# Patient Record
Sex: Female | Born: 1965 | Race: White | Hispanic: No | Marital: Married | Smoking: Current some day smoker
Health system: Southern US, Community
[De-identification: ages and names within clinical notes are randomized; demographics above are authoritative.]

## PROBLEM LIST (undated history)

## (undated) DIAGNOSIS — J189 Pneumonia, unspecified organism: Secondary | ICD-10-CM

## (undated) DIAGNOSIS — E538 Deficiency of other specified B group vitamins: Secondary | ICD-10-CM

## (undated) DIAGNOSIS — Z720 Tobacco use: Secondary | ICD-10-CM

## (undated) DIAGNOSIS — Z951 Presence of aortocoronary bypass graft: Secondary | ICD-10-CM

## (undated) DIAGNOSIS — D539 Nutritional anemia, unspecified: Secondary | ICD-10-CM

## (undated) DIAGNOSIS — E785 Hyperlipidemia, unspecified: Secondary | ICD-10-CM

## (undated) DIAGNOSIS — I2510 Atherosclerotic heart disease of native coronary artery without angina pectoris: Secondary | ICD-10-CM

## (undated) HISTORY — DX: Nutritional anemia, unspecified: D53.9

## (undated) HISTORY — DX: Tobacco use: Z72.0

## (undated) HISTORY — DX: Hyperlipidemia, unspecified: E78.5

## (undated) HISTORY — DX: Atherosclerotic heart disease of native coronary artery without angina pectoris: I25.10

## (undated) HISTORY — DX: Presence of aortocoronary bypass graft: Z95.1

## (undated) HISTORY — DX: Deficiency of other specified B group vitamins: E53.8

## (undated) HISTORY — PX: CORONARY ARTERY BYPASS GRAFT: SHX141

---

## 2005-06-28 ENCOUNTER — Inpatient Hospital Stay (HOSPITAL_COMMUNITY): Admission: RE | Admit: 2005-06-28 | Discharge: 2005-06-30 | Payer: Self-pay | Admitting: Obstetrics and Gynecology

## 2011-07-18 HISTORY — PX: CARDIAC CATHETERIZATION: SHX172

## 2011-08-09 ENCOUNTER — Encounter: Payer: Self-pay | Admitting: *Deleted

## 2011-08-09 ENCOUNTER — Telehealth: Payer: Self-pay | Admitting: *Deleted

## 2011-08-09 DIAGNOSIS — R079 Chest pain, unspecified: Secondary | ICD-10-CM

## 2011-08-09 DIAGNOSIS — R9430 Abnormal result of cardiovascular function study, unspecified: Secondary | ICD-10-CM

## 2011-08-09 NOTE — Telephone Encounter (Signed)
Per Dr. Kirke Corin, pt needs outpt cath. Currently inpt at [facility].  Cath scheduled. [facility] staff aware. Pt still inpt on 4th floor. They are aware cath is scheduled and instructions will be faxed to them at [phone]. They are asked to give pt instructions and have her call with any questions.

## 2011-08-13 ENCOUNTER — Telehealth: Payer: Self-pay | Admitting: *Deleted

## 2011-08-13 NOTE — Telephone Encounter (Signed)
Kelsey Padilla called in regards to her Cath on Wednesday, August 15, 2011. States that she takes A vitamin -B 12 injection on Wednesday morning. Is she suppose to take this injection before Her cath on Wednesday?

## 2011-08-13 NOTE — Telephone Encounter (Signed)
What insurance does the patient have?

## 2011-08-13 NOTE — Telephone Encounter (Signed)
No precert required.  Patient is self pay

## 2011-08-13 NOTE — Telephone Encounter (Signed)
Message     (L) Heart Cath Main Lab Dr. Duard Larsen, August 15, 2011

## 2011-08-13 NOTE — Telephone Encounter (Signed)
I just called Kelsey Padilla to verify and she is self-pay.

## 2011-08-13 NOTE — Telephone Encounter (Signed)
Pt states she will have to leave for [facility] around 6 or 6:30 am. She is aware she can do B12 injection Tues (if Wed am is too early) or Wed before the cath. Pt verbalized understanding.

## 2011-08-15 ENCOUNTER — Inpatient Hospital Stay (HOSPITAL_COMMUNITY)
Admission: RE | Admit: 2011-08-15 | Discharge: 2011-08-20 | DRG: 234 | Disposition: A | Discharge status: Home / Self Care | Payer: Medicaid Other | Source: Ambulatory Visit | Attending: Surgery | Admitting: Surgery

## 2011-08-15 ENCOUNTER — Ambulatory Visit (HOSPITAL_COMMUNITY): Payer: Medicaid Other

## 2011-08-15 ENCOUNTER — Inpatient Hospital Stay (HOSPITAL_COMMUNITY): Payer: Medicaid Other

## 2011-08-15 DIAGNOSIS — I2510 Atherosclerotic heart disease of native coronary artery without angina pectoris: Principal | ICD-10-CM | POA: Diagnosis present

## 2011-08-15 DIAGNOSIS — D62 Acute posthemorrhagic anemia: Secondary | ICD-10-CM | POA: Diagnosis not present

## 2011-08-15 DIAGNOSIS — F17200 Nicotine dependence, unspecified, uncomplicated: Secondary | ICD-10-CM | POA: Diagnosis present

## 2011-08-15 DIAGNOSIS — E8779 Other fluid overload: Secondary | ICD-10-CM | POA: Diagnosis not present

## 2011-08-15 DIAGNOSIS — Z7982 Long term (current) use of aspirin: Secondary | ICD-10-CM

## 2011-08-15 DIAGNOSIS — Z01810 Encounter for preprocedural cardiovascular examination: Secondary | ICD-10-CM

## 2011-08-15 DIAGNOSIS — J9383 Other pneumothorax: Secondary | ICD-10-CM | POA: Diagnosis present

## 2011-08-15 DIAGNOSIS — Q2111 Secundum atrial septal defect: Secondary | ICD-10-CM

## 2011-08-15 DIAGNOSIS — Q211 Atrial septal defect: Secondary | ICD-10-CM

## 2011-08-15 DIAGNOSIS — D518 Other vitamin B12 deficiency anemias: Secondary | ICD-10-CM | POA: Diagnosis present

## 2011-08-15 DIAGNOSIS — Z8249 Family history of ischemic heart disease and other diseases of the circulatory system: Secondary | ICD-10-CM

## 2011-08-15 LAB — CBC
HCT: 29 % — ABNORMAL LOW (ref 36.0–46.0)
MCH: 37.3 pg — ABNORMAL HIGH (ref 26.0–34.0)
MCV: 108.2 fL — ABNORMAL HIGH (ref 78.0–100.0)
RDW: 22.4 % — ABNORMAL HIGH (ref 11.5–15.5)
WBC: 6.3 10*3/uL (ref 4.0–10.5)

## 2011-08-15 LAB — BASIC METABOLIC PANEL
BUN: 13 mg/dL (ref 6–23)
CO2: 23 mEq/L (ref 19–32)
Calcium: 9.2 mg/dL (ref 8.4–10.5)
Creatinine, Ser: 0.72 mg/dL (ref 0.50–1.10)
GFR calc non Af Amer: >60 mL/min (ref >60)
Glucose, Bld: 99 mg/dL (ref 70–99)
Sodium: 139 mEq/L (ref 135–145)

## 2011-08-15 LAB — ABO/RH: ABO/RH(D): A POS

## 2011-08-16 ENCOUNTER — Inpatient Hospital Stay (HOSPITAL_COMMUNITY): Payer: Medicaid Other

## 2011-08-16 DIAGNOSIS — Z951 Presence of aortocoronary bypass graft: Secondary | ICD-10-CM | POA: Insufficient documentation

## 2011-08-16 DIAGNOSIS — I2510 Atherosclerotic heart disease of native coronary artery without angina pectoris: Secondary | ICD-10-CM

## 2011-08-16 HISTORY — DX: Presence of aortocoronary bypass graft: Z95.1

## 2011-08-16 LAB — POCT I-STAT 4, (NA,K, GLUC, HGB,HCT)
Glucose, Bld: 113 mg/dL — ABNORMAL HIGH (ref 70–99)
Glucose, Bld: 124 mg/dL — ABNORMAL HIGH (ref 70–99)
HCT: 22 % — ABNORMAL LOW (ref 36.0–46.0)
HCT: 23 % — ABNORMAL LOW (ref 36.0–46.0)
HCT: 24 % — ABNORMAL LOW (ref 36.0–46.0)
Hemoglobin: 8.5 g/dL — ABNORMAL LOW (ref 12.0–15.0)
Hemoglobin: 7.5 g/dL — ABNORMAL LOW (ref 12.0–15.0)
Hemoglobin: 8.2 g/dL — ABNORMAL LOW (ref 12.0–15.0)
Hemoglobin: 9.9 g/dL — ABNORMAL LOW (ref 12.0–15.0)
Potassium: 4 mEq/L (ref 3.5–5.1)
Potassium: 4.2 mEq/L (ref 3.5–5.1)
Potassium: 5.5 mEq/L — ABNORMAL HIGH (ref 3.5–5.1)
Potassium: 4.1 mEq/L (ref 3.5–5.1)
Sodium: 140 mEq/L (ref 135–145)
Sodium: 138 mEq/L (ref 135–145)

## 2011-08-16 LAB — GLUCOSE, CAPILLARY
Glucose-Capillary: 147 mg/dL — ABNORMAL HIGH (ref 70–99)
Glucose-Capillary: 125 mg/dL — ABNORMAL HIGH (ref 70–99)
Glucose-Capillary: 136 mg/dL — ABNORMAL HIGH (ref 70–99)
Glucose-Capillary: 125 mg/dL — ABNORMAL HIGH (ref 70–99)

## 2011-08-16 LAB — COMPREHENSIVE METABOLIC PANEL
Albumin: 3.1 g/dL — ABNORMAL LOW (ref 3.5–5.2)
BUN: 10 mg/dL (ref 6–23)
Calcium: 8.4 mg/dL (ref 8.4–10.5)
Creatinine, Ser: 0.72 mg/dL (ref 0.50–1.10)
GFR calc Af Amer: >60 mL/min (ref >60)
Glucose, Bld: 93 mg/dL (ref 70–99)
Total Protein: 6.2 g/dL (ref 6.0–8.3)

## 2011-08-16 LAB — CBC
HCT: 28.9 % — ABNORMAL LOW (ref 36.0–46.0)
MCH: 35.9 pg — ABNORMAL HIGH (ref 26.0–34.0)
MCHC: 34.4 g/dL (ref 30.0–36.0)
MCV: 106 fL — ABNORMAL HIGH (ref 78.0–100.0)
Platelets: 242 10*3/uL (ref 150–400)
Platelets: 192 10*3/uL (ref 150–400)
Platelets: 255 10*3/uL (ref 150–400)
RBC: 2.34 MIL/uL — ABNORMAL LOW (ref 3.87–5.11)
RDW: 22.7 % — ABNORMAL HIGH (ref 11.5–15.5)
RDW: 23.5 % — ABNORMAL HIGH (ref 11.5–15.5)
RDW: 23.8 % — ABNORMAL HIGH (ref 11.5–15.5)
WBC: 6.2 10*3/uL (ref 4.0–10.5)
WBC: 12.8 10*3/uL — ABNORMAL HIGH (ref 4.0–10.5)
WBC: 13.4 10*3/uL — ABNORMAL HIGH (ref 4.0–10.5)

## 2011-08-16 LAB — LIPID PANEL
Cholesterol: 130 mg/dL (ref 0–200)
HDL: 25 mg/dL — ABNORMAL LOW (ref >39)
Total CHOL/HDL Ratio: 5.2 RATIO
Triglycerides: 91 mg/dL (ref <150)
VLDL: 18 mg/dL (ref 0–40)

## 2011-08-16 LAB — POCT I-STAT 3, ART BLOOD GAS (G3+)
Acid-base deficit: 2 mmol/L (ref 0.0–2.0)
Bicarbonate: 22.8 mEq/L (ref 20.0–24.0)
O2 Saturation: 100 %
Patient temperature: 35.9
Patient temperature: 37.4
TCO2: 22 mmol/L (ref 0–100)
pCO2 arterial: 38.8 mmHg (ref 35.0–45.0)
pH, Arterial: 7.354 (ref 7.350–7.400)
pH, Arterial: 7.391 (ref 7.350–7.400)
pH, Arterial: 7.398 (ref 7.350–7.400)
pO2, Arterial: 187 mmHg — ABNORMAL HIGH (ref 80.0–100.0)
pO2, Arterial: 126 mmHg — ABNORMAL HIGH (ref 80.0–100.0)

## 2011-08-16 LAB — BLOOD GAS, ARTERIAL
Acid-base deficit: 1.4 mmol/L (ref 0.0–2.0)
Drawn by: 229971
FIO2: 0.21 %
O2 Saturation: 97.2 %
pO2, Arterial: 77.9 mmHg — ABNORMAL LOW (ref 80.0–100.0)

## 2011-08-16 LAB — APTT: aPTT: 31 seconds (ref 24–37)

## 2011-08-16 LAB — POCT I-STAT GLUCOSE: Operator id: 3342

## 2011-08-16 LAB — POCT I-STAT, CHEM 8
Calcium, Ion: 1.13 mmol/L (ref 1.12–1.32)
HCT: 31 % — ABNORMAL LOW (ref 36.0–46.0)
Hemoglobin: 10.5 g/dL — ABNORMAL LOW (ref 12.0–15.0)
TCO2: 20 mmol/L (ref 0–100)

## 2011-08-16 LAB — HEMOGLOBIN AND HEMATOCRIT, BLOOD: Hemoglobin: 7.4 g/dL — ABNORMAL LOW (ref 12.0–15.0)

## 2011-08-16 LAB — POCT I-STAT 3, VENOUS BLOOD GAS (G3P V): pH, Ven: 7.353 — ABNORMAL HIGH (ref 7.250–7.300)

## 2011-08-16 LAB — URINALYSIS, ROUTINE W REFLEX MICROSCOPIC
Bilirubin Urine: NEGATIVE
Glucose, UA: NEGATIVE mg/dL
Hgb urine dipstick: NEGATIVE
Ketones, ur: NEGATIVE mg/dL
Protein, ur: NEGATIVE mg/dL

## 2011-08-16 LAB — CREATININE, SERUM
GFR calc Af Amer: >60 mL/min (ref >60)
GFR calc non Af Amer: >60 mL/min (ref >60)

## 2011-08-16 LAB — PROTIME-INR
INR: 1.37 (ref 0.00–1.49)
Prothrombin Time: 17.1 seconds — ABNORMAL HIGH (ref 11.6–15.2)

## 2011-08-16 LAB — HEMOGLOBIN A1C: Mean Plasma Glucose: 117 mg/dL — ABNORMAL HIGH (ref <117)

## 2011-08-17 ENCOUNTER — Inpatient Hospital Stay (HOSPITAL_COMMUNITY): Payer: Medicaid Other

## 2011-08-17 LAB — GLUCOSE, CAPILLARY
Glucose-Capillary: 127 mg/dL — ABNORMAL HIGH (ref 70–99)
Glucose-Capillary: 112 mg/dL — ABNORMAL HIGH (ref 70–99)
Glucose-Capillary: 117 mg/dL — ABNORMAL HIGH (ref 70–99)
Glucose-Capillary: 119 mg/dL — ABNORMAL HIGH (ref 70–99)
Glucose-Capillary: 118 mg/dL — ABNORMAL HIGH (ref 70–99)

## 2011-08-17 LAB — CBC
Hemoglobin: 9.1 g/dL — ABNORMAL LOW (ref 12.0–15.0)
MCH: 34.1 pg — ABNORMAL HIGH (ref 26.0–34.0)
MCHC: 34.6 g/dL (ref 30.0–36.0)
MCV: 100.7 fL — ABNORMAL HIGH (ref 78.0–100.0)
Platelets: 308 10*3/uL (ref 150–400)
RBC: 2.67 MIL/uL — ABNORMAL LOW (ref 3.87–5.11)
RDW: 24 % — ABNORMAL HIGH (ref 11.5–15.5)
WBC: 9.3 10*3/uL (ref 4.0–10.5)
WBC: 9 10*3/uL (ref 4.0–10.5)

## 2011-08-17 LAB — BASIC METABOLIC PANEL
CO2: 20 mEq/L (ref 19–32)
Glucose, Bld: 120 mg/dL — ABNORMAL HIGH (ref 70–99)
Potassium: 3.8 mEq/L (ref 3.5–5.1)
Sodium: 138 mEq/L (ref 135–145)

## 2011-08-17 LAB — CREATININE, SERUM
GFR calc Af Amer: >60 mL/min (ref >60)
GFR calc non Af Amer: >60 mL/min (ref >60)

## 2011-08-17 LAB — MAGNESIUM: Magnesium: 2.8 mg/dL — ABNORMAL HIGH (ref 1.5–2.5)

## 2011-08-17 NOTE — Consult Note (Signed)
NAMERAYSHELL, GOECKE NO.:  [id number]  MEDICAL RECORD NO.:  [id number]  LOCATION:  6527                         FACILITY:  [facility]  PHYSICIAN:  Evelene Croon, M.D.     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  CLINICAL HISTORY:  I was asked by Dr. Kirke Corin to evaluate Ms. Muckey for consideration of coronary bypass graft surgery.  She is a 45 year old woman with history of smoking, hyperlipidemia, and a family history of heart disease who reports having progressively worsening symptoms of substernal chest discomfort radiating up into her left shoulder, scapular region, neck, and left arm since around April of 2012.  She said her symptoms were typically occurring with exertion while walking her child to and from school.  Symptoms were always relieved with rest. Since then has been occurring with less and less exertion.  She became quite concerned last week that she had significant heart disease and presented to [facility].  Electrocardiogram and enzymes were unremarkable.  She was noted to be markedly anemic with hemoglobin of 7.5 and was diagnosed with a vitamin B12 deficiency anemia.  Vitamin B12 injections were started and she was transfused 2 units of blood.  She said that her energy level improved.  She said that it was recommended that she be transferred to [facility] for catheterization but she decided that she wanted to go home to see her 39-year-old son who is very attached to her.  She said that after discharge to home, she has had no further symptoms.  She thought that maybe it was all due to her anemia but decided to come in for scheduled catheterization today anyway. Catheterization showed 70% left main stenosis.  The LAD had  about 80% ostial stenosis followed by 60-70% proximal stenosis.  The distal portion the LAD was still a fairly large vessel.  The left circumflex was nondominant, had about 70% ostial stenosis, and diffuse 60% diseased proximally.  The second marginal branch was a moderate-sized vessel. The right coronary artery was dominant vessel that was underfilled due to diffuse proximal disease.  The proximal portion had about 70% diffuse disease.  There was a 90% distal stenosis before the takeoff of posterior descending branch.  The proximal posterior descending had about 40-50% stenosis.  Left ventricular function was normal.  REVIEW OF SYSTEMS:  GENERAL:  She denies any fever or chills.  She has had no recent weight changes.  She has reported significant fatigue and generalized weakness.  EYES:  Negative.  ENT:  Negative.  ENDOCRINE: She denies diabetes and hypothyroidism.  CARDIOVASCULAR:  As above.  She denies PND and orthopnea.  She has had no peripheral edema or palpitations.  She has had exertional dyspnea.  RESPIRATORY:  She denies cough and sputum production.  GI:  She has had no nausea or vomiting. She denies melena and bright red blood per rectum.  GU:  She denies dysuria and hematuria.  MUSCULOSKELETAL:  She  denies arthralgias and myalgias.  NEUROLOGICAL:  She denies any focal weakness and numbness. She denies dizziness and syncope.  She has never had TIA or stroke. PSYCHIATRIC:  Negative.  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  PAST MEDICAL HISTORY:  Significant for hyperlipidemia that has been untreated.  She has a history of newly diagnosed vitamin B12 deficiency anemia.  She is status post 2 C-sections.  MEDICATIONS:  As noted on her medicine reconciliation form in the chart.  ALLERGIES:  None.  SOCIAL HISTORY:  She is married and has 2 children.  She has a son who is 73 years old and has some behavioral and  developmental problems.  She also has a 63 year old daughter who lives with her and her husband and her daughter has a 34-month-old child that lives with them.  The patient smokes about half pack of cigarettes per day.  She denies alcohol and drug abuse.  She said she is under a lot of stress due to her husband having bipolar disorder as well as her son's difficulties.  FAMILY HISTORY:  Positive for cardiac disease.  Her maternal grandfather died in his 35s of myocardial infarction.  A maternal aunt also had heart disease.  There is family history of leukemia.  PHYSICAL EXAMINATION:  VITAL SIGNS:  Her blood pressure is 125/75, pulse is 70 and regular, respiratory rate is 16 and unlabored. GENERAL:  She is a well-developed white female in no distress. HEENT:  Shows to be normocephalic and atraumatic.  Pupils are equal, reactive to light and accommodation.  Extraocular muscles are intact. Oropharynx is clear. NECK:  Shows normal carotid pulses bilaterally.  There are no bruits. There is no adenopathy or thyromegaly. CARDIAC:  Shows regular rate and rhythm with a normal S1 and S2.  There is no murmur, rub, or gallop. LUNGS:  Clear. ABDOMEN:  Shows active bowel sounds.  Abdomen is soft and nontender. There are no palpable masses or organomegaly. EXTREMITIES:  Shows no peripheral edema.  Pedal pulses are palpable bilaterally. SKIN:  Warm and dry. NEUROLOGIC:  Shows to be alert and oriented x3.  Motor and sensory exams grossly normal.  IMPRESSION:  Ms. Epperly has left main and severe 3-vessel coronary disease with unstable anginal symptoms.  I agree that coronary artery bypass graft surgery is the best treatment to prevent further ischemia and infarction and improve her quality of life.  I would plan to use a left internal mammary graft to her left anterior descending.  She has a weak left radial pulse and fairly short arms and I do not think a radial artery graft would be ideal to  place to either her left circumflex or right coronary arteries since it probably would not be long after reach and feels that it is a relatively small vessel.  She had a right radial artery catheterization approach.  I also would not plan to use her right internal mammary artery because she is relatively short.  I do not think it would reach beyond the disease in her right coronary artery.  I would plan to use saphenous vein harvested endoscopically for her distal right coronary graft as well as to the obtuse marginal graft.  I discussed all this with her including alternatives, benefits, and risks including but not limited to bleeding, blood transfusion, infection, stroke, myocardial infarction, graft failure, and death.  She understands all this and agrees to proceed.  We will plan to do surgery tomorrow August 16, 2011.     Evelene Croon, M.D.     BB/MEDQ  D:  08/15/2011  T:  08/15/2011  Job:  [id number]  Electronically Signed by Evelene Croon M.D. on 08/17/2011 01:17:40 PM

## 2011-08-17 NOTE — Op Note (Signed)
Kelsey Padilla, Kelsey Padilla NO.:  [id number]  MEDICAL RECORD NO.:  [id number]  LOCATION:  2314                         FACILITY:  [facility]  PHYSICIAN:  Evelene Croon, M.D.     DATE OF BIRTH:  19-Dec-1965  DATE OF PROCEDURE:  08/16/2011 DATE OF DISCHARGE:                              OPERATIVE REPORT   PREOPERATIVE DIAGNOSES:  Left main and severe three-vessel coronary disease with unstable angina.  POSTOPERATIVE DIAGNOSIS:  Left main and severe three-vessel coronary disease with unstable angina.  OPERATIVE PROCEDURE:  Median sternotomy, extracorporeal circulation, coronary artery bypass graft surgery x3 using a left internal mammary artery graft, a left anterior descending coronary, with a saphenous vein graft to the obtuse marginal branch of the left circumflex coronary artery, and a saphenous vein graft to the posterior descending branch of the right coronary artery.  Endoscopic vein harvesting from the right leg.  ATTENDING SURGEON:  Evelene Croon, MD  ASSISTANT:  Coral Ceo, PA-C  ANESTHESIA:  General endotracheal.  CLINICAL HISTORY:  This patient is a 45 year old woman with history of smoking, hyperlipidemia, and a family history of heart disease, who presented with a several-month history of progressive exertional substernal chest discomfort radiating into her left shoulder, scapular area, neck, and arm and associated with generalized weakness and shortness of breath.  The symptoms have continued to worsen and now are occurring with minimal ambulation.  She was admitted to [facility] last week with these symptoms and ruled out for myocardial infarction.  She had an abnormal stress test.  She was also noted to be markedly anemic with hemoglobin 7.5 on admission and was diagnosed with a megaloblastic anemia and B12 deficiency.  She was transfused 2 units of blood and started on B12 injections.  It was recommended that she will be transferred to [facility] for catheterization, but she wants to go home first and was therefore discharged and came back yesterday for outpatient cardiac catheterization.  This showed 60% distal left main stenosis.  The LAD had about 80% ostial stenosis and a 50-60% proximal stenosis.  The left circumflex had about 80% ostial stenosis.  There was a small first marginal branch was not graftable and a moderate-sized second marginal that bifurcated and was suitable for grafting.  The right coronary artery was a dominant vessel which had diffuse 60-70% proximal and mid vessel stenosis and about 90%-95% distal stenosis before the posterior descending branch.  The posterior descending itself had about 40-50% mid vessel stenosis.  The vessel appeared to be somewhat underfilled.  Left ventricular function was normal.  After review of the catheterization and examination of the patient, it was felt that coronary artery bypass graft surgery is the best treatment to prevent further ischemia and infarction and improve her quality of life. She had fairly short forearms and a weak left radial pulse and therefore I did not feel a radial artery would be suitable for use in this patient.  The right radial was used for catheterization.  She also had a relatively short chest and I did not feel that the right internal mammary artery would be long enough to reach down beyond the disease in the right  coronary artery.  Therefore, I plan to use a left internal mammary graft to the LAD and vein grafts to the second obtuse marginal and right coronary artery system.  I discussed the operative procedure with the patient including alternatives, benefits, and risks including, but not limited to bleeding, blood transfusion, infection, stroke, myocardial infarction, graft failure, and death.  She understood and agreed to proceed.  I also discussed the importance of maximum cardiac risk factor reduction including complete smoking cessation,  avoidance second hand smoke, and control of her hyperlipidemia.  She understood all this and agreed to proceed.  OPERATIVE PROCEDURE:  The patient was taken to the operating room and placed on table in supine position.  After induction of general endotracheal anesthesia, a Foley cath was placed in bladder using sterile technique.  Then, the chest, abdomen and both lower extremities were prepped and draped in usual sterile manner.  The chest was entered through a median sternotomy incision and the pericardium opened midline. Examination of the heart showed good ventricular contractility.  The ascending aorta had no palpable plaques in it.  Transesophageal echocardiogram was performed by Anesthesiology.  I did not request that, but decision was made by Anesthesiology to place the echocardiogram.  I was notified after we were already cannulating in going on bypass the patient had a small PFO.  We did not have a bicaval cannulation and this was an asymptomatic small PFO, and therefore, I felt there was no indication for fixing that.  Then, the left internal mammary artery was harvested from the chest wall as pedicle graft.  This was a medium caliber vessel with excellent blood flow through it.  At the same time, a segment of greater saphenous vein was harvested from the right leg.  This vein was of medium size and good quality.  Then, the patient was heparinized, when an adequate ACT was obtained, the distal ascending aorta was cannulated using a 20-French aortic cannula for arterial inflow.  Venous outflow was achieved using a two- stage venous cannula through the right atrial appendage.  Antegrade cardioplegia and vent cannula was inserted in the aortic root.  The patient was placed on cardiopulmonary bypass and the distal coronaries were identified.  The LAD was diffusely diseased throughout its proximal and midportions.  The only part that did not have significant disease was in  the distal portion.  The obtuse marginal was also diffusely diseased.  This was visible proximally, but then became intramyocardial.  I located in its midportion within the muscle and was even diseased there.  The vessel was traced more distally in the muscle and the distal portion was suitable for grafting.  The right coronary artery was also diffusely diseased with calcific plaque and this extended to the takeoff of the posterior descending branch.  I traced the distal right coronary artery out to beyond the take off the posterior descending branches and were still calcified plaque within the artery.  The posterior descending itself was small, but graftable proximally.  There was a small plaque in the midportion corresponded to the 40-50% stenosis seen on catheterization.  The vessel beyond this was too small to graft.  The posterolateral branch was smaller than the posterior descending and likewise was diffusely diseased.  Then, the aorta was crossclamped and 600 mL of cold blood antegrade cardioplegia was administered in the aortic root with quick arrest of the heart.  Systemic hypothermia to 20 degrees centigrade and topical hypothermic iced saline was used.  Temperature probe was  placed in septum insulating pad in the pericardium.  The first distal anastomosis was performed to the posterior descending coronary artery.  The internal diameter proximally was about 1.5 mm. Conduit used was a segment of greater saphenous vein and anastomosis formed end-to-side manner continuous 7-0 Prolene suture.  Flow was noted through the graft and was excellent.  Second distal anastomosis was performed to the second marginal branch. The internal diameter distally was about 1.5 mm.  Conduit used was a segment of greater saphenous vein and the anastomosis performed in end- to-side manner continuous 7-0 Prolene suture.  Flow was noted through the graft  and was excellent.  Then, a dose of  cardioplegia was given down the vein grafts and aortic root.  The third distal anastomosis was performed to the distal LAD.  The internal diameter here was about 1.6 mm.  Conduit used was the left internal mammary graft was brought through an opening of the left pericardium anterior of the phrenic nerve.  Then, anastomosed to the LAD in end-to-side manner continuous 8-0 Prolene suture.  The pedicle was tacked to the epicardium with 6-0 Prolene sutures.  The patient was then rewarmed to 37 degrees centigrade.  Another dose cardioplegia was given and with crossclamp in place the two proximal vein graft anastomoses were performed of the mid ascending aorta in an end-to-side manner continuous 6-0 Prolene suture.  Then, the clamp was removed from mammary pedicle.  There was rapid warming of the ventricular septum and return of spontaneous ventricular fibrillation.  Crossclamp was removed with time of 72 minutes and the patient spontaneously converted to sinus rhythm.  The proximal and distal anastomoses appeared hemostatic while all the grafts satisfactory.  Graft markers placed around the proximal anastomoses.  Two temporary right ventricular and right atrial pacing wires placed above the skin.  When the patient rewarmed to 37 degrees centigrade, she was weaned from cardiopulmonary bypass on no inotropic agents.  Total bypass time was 94 minutes.  Cardiac function appeared excellent.  Cardiac output of 7 liters minute.  Protamine was given and the venous and aortic cannulas removed without difficulty.  Hemostasis was achieved.  Three chest tubes were placed with the tube in the  posterior pericardium, one in left pleural space and one in anterior mediastinum.  The sternum was then closed with #6 stainless steel wires.  Fascia was closed with continuous #1 Vicryl suture.  Subcutaneous tissue was closed with continuous 2-0 Vicryl and skin with 3-0 Vicryl subcuticular closure.  The  lower extremity vein harvest site was closed in layers in similar manner.  The sponge, needle and instruments counts were correct according to the scrub nurse.  Dry sterile dressing was applied over the incisions around chest tubes which were hooked to Pleur-Evac suction.  The patient remained hemodynamically stable and transferred to the SICU in guarded, but stable condition.     Evelene Croon, M.D.     BB/MEDQ  D:  08/16/2011  T:  08/16/2011  Job:  [id number]  cc:   Allene Dillon, MD  Electronically Signed by Evelene Croon M.D. on 08/17/2011 01:17:42 PM

## 2011-08-18 ENCOUNTER — Inpatient Hospital Stay (HOSPITAL_COMMUNITY): Payer: Medicaid Other

## 2011-08-18 LAB — CBC
Hemoglobin: 9 g/dL — ABNORMAL LOW (ref 12.0–15.0)
MCH: 34.1 pg — ABNORMAL HIGH (ref 26.0–34.0)
MCHC: 33.7 g/dL (ref 30.0–36.0)
MCV: 101.1 fL — ABNORMAL HIGH (ref 78.0–100.0)

## 2011-08-18 LAB — BASIC METABOLIC PANEL
CO2: 24 mEq/L (ref 19–32)
Calcium: 8.4 mg/dL (ref 8.4–10.5)
Creatinine, Ser: 0.65 mg/dL (ref 0.50–1.10)
GFR calc non Af Amer: >60 mL/min (ref >60)
Glucose, Bld: 114 mg/dL — ABNORMAL HIGH (ref 70–99)
Sodium: 139 mEq/L (ref 135–145)

## 2011-08-19 ENCOUNTER — Inpatient Hospital Stay (HOSPITAL_COMMUNITY): Payer: Medicaid Other

## 2011-08-19 LAB — CBC
MCH: 33.7 pg (ref 26.0–34.0)
MCHC: 33.6 g/dL (ref 30.0–36.0)
MCV: 100.4 fL — ABNORMAL HIGH (ref 78.0–100.0)
Platelets: 407 10*3/uL — ABNORMAL HIGH (ref 150–400)
RBC: 2.82 MIL/uL — ABNORMAL LOW (ref 3.87–5.11)
RDW: 23 % — ABNORMAL HIGH (ref 11.5–15.5)

## 2011-08-19 LAB — CROSSMATCH
Antibody Screen: NEGATIVE
Unit division: 0
Unit division: 0
Unit division: 0
Unit division: 0

## 2011-08-19 LAB — BASIC METABOLIC PANEL
Calcium: 8.7 mg/dL (ref 8.4–10.5)
Creatinine, Ser: 0.63 mg/dL (ref 0.50–1.10)
GFR calc Af Amer: >60 mL/min (ref >60)
GFR calc non Af Amer: >60 mL/min (ref >60)
Sodium: 140 mEq/L (ref 135–145)

## 2011-08-19 LAB — GLUCOSE, CAPILLARY
Glucose-Capillary: 133 mg/dL — ABNORMAL HIGH (ref 70–99)
Glucose-Capillary: 119 mg/dL — ABNORMAL HIGH (ref 70–99)
Glucose-Capillary: 141 mg/dL — ABNORMAL HIGH (ref 70–99)

## 2011-08-20 ENCOUNTER — Inpatient Hospital Stay (HOSPITAL_COMMUNITY): Payer: Medicaid Other

## 2011-08-27 NOTE — Discharge Summary (Signed)
NAMEMCKENSIE, SCOTTI NO.:  [id number]  MEDICAL RECORD NO.:  [id number]  LOCATION:  2011                         FACILITY:  [facility]  PHYSICIAN:  Evelene Croon, M.D.     DATE OF BIRTH:  May 06, 1966  DATE OF ADMISSION:  08/15/2011 DATE OF DISCHARGE:  08/20/2011                              DISCHARGE SUMMARY   HISTORY:  The patient is a 45 year old white female with a history of smoking and hyperlipidemia as well as family history of heart disease who has been having progressively worsening symptoms of substernal chest discomfort radiating up into her left shoulder, scapular, and neck region since around April 2012.  She stated her symptoms would typically occur with exertion while walking her child to and from school. Symptoms were always relieved with rest.  They have been occurring, however, with less and less exertion.  She became increasingly concerned and presented to [facility] where an electrocardiogram and enzymes were unremarkable.  She was noted to be markedly anemic with a hemoglobin of 7.5 and was diagnosed with a B12 deficiency anemia and additionally was given 2 units of blood.  She stated that her energy level did improve following this.  She did, however, state in addition that she was recommended to be transferred to [facility] for further evaluation to include cardiac catheterization, but she declined choosing to go home.  Following discharge, she had no further symptoms and thought it was primarily due to her anemia, but did reconsider the decision to undergo cardiac catheterization and was admitted this hospitalization for the procedure.  The catheterization revealed a 70% left main stenosis and LAD lesion with about an 80% ostial stenosis followed by a 60-70% proximal stenosis.  The distal portion of the LAD was still a fairly large vessel.  The left circumflex was nondominant, had a 70% ostial stenosis with diffuse 60% disease proximally.   The second marginal branch was a moderate-sized vessel.  The right coronary artery was a dominant vessel that was underfilled due to diffuse proximal disease.  Proximal portion had about a 70% diffuse lesion. There was a 90% distal stenosis before the takeoff of the posterior descending branch.  The posterior branch had about a 40-50% stenosis. Left ventricular function was normal.  Due to these findings, surgical consultation was then obtained with Evelene Croon, MD, who evaluated the patient and her studies and agreed with recommendations to proceed with surgical coronary artery revascularization.  REVIEW OF SYMPTOMS:  Please see the history and physical.  PAST MEDICAL HISTORY:  Hyperlipidemia which has been untreated, newly diagnosed B12 deficiency anemia.  PAST SURGICAL HISTORY:  C-section x2.  MEDICATIONS PRIOR TO ADMISSION:  Birth control injection q. monthly, Sudafed p.r.n., and vitamin B12 injections 5 mg weekly for 2 weeks, then 1 mg monthly.  She was also taking ibuprofen p.r.n., metoprolol 25 mg b.i.d., aspirin 81 mg daily, and sublingual nitroglycerin p.r.n.  ALLERGIES:  None.  SOCIAL HISTORY:  She is married and has 2 children.  She has a son who is 25 years old and has some behavioral and developmental disabilities. She also has a 74 year old daughter who lives with her and her husband and her  daughter has a 87-month-old child that lives with them.  The patient smokes about half pack of cigarettes per day.  She denies alcohol or drug abuse.  She does state that she has significant stress related to her husband having bipolar disorder as well as her son's difficulties.  FAMILY HISTORY:  Positive for cardiac disease.  Her maternal grandfather died in his 50s of a myocardial infarction.  A maternal aunt also has heart disease.  There is also a family history of leukemia.  PHYSICAL EXAMINATION:  Please see the history and physical done at the time of  admission.  HOSPITAL COURSE:  The patient was admitted for cardiac catheterization which was done and as described.  She was felt to be medically stable for proceeding with the surgical revascularization and on August 16, 2011, she underwent coronary artery bypass grafting x3.  The following grafts were placed. 1. Left internal mammary artery to the LAD. 2. Saphenous vein graft to the obtuse marginal #1 and #3, a saphenous     vein graft to the posterior descending.  She tolerated the     procedure well and was taken to the Surgical Intensive Care Unit in     stable condition.  POSTOPERATIVE HOSPITAL COURSE:  She has progressed nicely.  All routine lines, monitors, and drainage devices have been discontinued in the standard fashion.  She has remained neurologically intact.  She has remained hemodynamically stable.  She has had no significant cardiac dysrhythmias.  She has been weaned from oxygen and maintains good saturations on room air.  She does have a mild volume overload, but is responding well to diuretics.  She does have acute blood loss anemia which is stable.  Her most recent hemoglobin/hematocrit dated August 19, 2011, are 9.5 and 28.3 respectively.  Incisions are healing well. She is tolerating routine advancement of activities using standard protocols.  Her overall status is felt to be stable for discharge on today's date August 20, 2011.  MEDICATIONS ON DISCHARGE: 1. Enteric-coated aspirin 325 mg p.o. daily. 2. Lasix 40 mg daily for 5 days. 3. Hydromorphone 2 mg q.4-6 h. p.r.n. 4. Lopressor 12.5 mg b.i.d. 5. Potassium chloride 20 mEq daily for 5 days. 6. Crestor 40 mg daily. 7. Birth control injection as previously. 8. Sudafed 1 tablet by mouth q.4 h. p.r.n. for sinus allergies. 9. She is also to do her previously described B12 injections.  INSTRUCTIONS:  The patient will receive written instructions regarding medications, activity, diet, wound care, and  followup.  FOLLOWUP:  Dr. Jari Sportsman office 2 weeks post discharge and Dr. Laneta Simmers 3 weeks post discharge.     Rowe Clack, P.A.-C.   ______________________________ Evelene Croon, M.D.    Sherryll Burger  D:  08/20/2011  T:  08/20/2011  Job:  [id number]  cc:   [facility] Office  Electronically Signed by Deniece Portela GOLD P.A.-C. on 08/23/2011 12:39:22 PM Electronically Signed by Evelene Croon M.D. on 08/27/2011 03:46:00 PM

## 2011-08-28 ENCOUNTER — Telehealth: Payer: Self-pay | Admitting: *Deleted

## 2011-08-28 NOTE — Telephone Encounter (Signed)
Switch to Simvastatin 40 mg daily # 30 with 6 refills (this should cost about 28 $ /month).

## 2011-08-29 MED ORDER — SIMVASTATIN 40 MG PO TABS: 40.0000 mg | ORAL_TABLET | Freq: Every evening | ORAL | Status: DC

## 2011-08-29 NOTE — Telephone Encounter (Signed)
Patient informed. Rx sent to [facility].

## 2011-09-04 NOTE — Cardiovascular Report (Signed)
NAMESHELIE, Kelsey Padilla NO.:  [id number]  MEDICAL RECORD NO.:  [id number]  LOCATION:  2807                         FACILITY:  [facility]  PHYSICIAN:  Lorine Bears, MD     DATE OF BIRTH:  1966-12-04  DATE OF PROCEDURE:  08/15/2011 DATE OF DISCHARGE:                           CARDIAC CATHETERIZATION   REFERRING PHYSICIAN:  Dr. Doyne Keel at [facility] in [place].    PROCEDURES PERFORMED: 1. Left heart catheterization. 2. Coronary angiography. 3. Left ventricular angiography.  ACCESS:  Right radial artery.  INDICATIONS AND CLINICAL HISTORY:  This is a 45 year old female with no previous cardiac history.  She presented to [facility] last week with symptoms of progressive exertional chest pain, dyspnea as well as left arm discomfort which has been going on for about 2-3 months and has been progressive.  The patient was found to be severely anemic with hemoglobin of 7.5 with an MCV of 135.  She was diagnosed with severe vitamin B12 deficiency.  She also had pancytopenia with low WBC and platelet count.  She was given vitamin B12 injection.  She also received 3 units of blood transfusion.  She underwent a treadmill stress test, which was abnormal with 1.5 mm of horizontal ST depression.  Due to her symptoms and abnormal stress test, I recommended proceeding with cardiac catheterization and possible coronary intervention.  Risks, benefits and alternatives were discussed with the patient.  The patient's risk factors for coronary artery disease include smoking, probably undiagnosed hyperlipidemia as well as family history of premature coronary artery disease.  STUDY DETAILS:  A standard informed consent was obtained.  The right radial area was prepped in a sterile fashion.  It was anesthetized with 1% lidocaine.  A 5-French sheath was placed in the right radial artery after anterior puncture, 35 units of an fractionated heparin was given intravenously, 3 mg of  verapamil was given through the arterial sheath. Right coronary angiography was performed with a Jackie catheter, which could not engage the left main.  The left main was engaged with a 3.5 JL catheter.  Left ventricular angiography was performed with a pigtail catheter.  All catheter exchanges were done over the wire.  The sheath was removed at the end of the case.  A TR-band was applied.  There was no immediate complications.  STUDY FINDINGS:  Hemodynamic findings:  Left ventricular pressure is 116/12 with a left ventricular end-diastolic pressure of 18 mmHg. Aortic pressure is 116/67 with a mean pressure of 88 mmHg.  Left ventricular angiography:  This showed normal LV systolic function and wall motion with an estimated ejection fraction of 55%.  No significant mitral regurgitation is noted.  Coronary angiography: 1. Left main coronary artery:  The vessel is normal in size.     Calcifications are noted in the distal left main extending into     both proximal, left circumflex and left anterior descending artery.     The left main has 70% stenosis distally at the bifurcation. 2. Left anterior descending artery:  The vessel is normal in size with     moderate calcifications, especially in the proximal and mid     segment.  There is  an 80% ostial stenosis followed by poststenotic     dilatation.  In the proximal segment just before the first septal     perforator, there is a 60-70% stenosis.  The rest of the LAD has     diffuse 30-40% disease.  The diagonal branches are overall medium     in size without significant ostial disease. 3. Left circumflex artery.  The vessel is normal in size and     nondominant.  There is a 70% ostial stenosis and diffuse 60%     disease proximally.  OM-1 is a very tiny branch.  OM-2 is normal in     size with 40% mid disease before bifurcating into 2 medium-sized     branches.  OM-3 is relatively small in size and free of significant     disease.  The  posterior AV groove is small in size and seems to     give 1 small posterolateral branch. 4. Right coronary artery:  The vessel appears to be a dominant, but     seems to be underfilling due to diffuse disease proximally as well     as severe distal disease.  Nitroglycerin was given intracoronary in     both vessels, right coronary artery as well as the left main     coronary artery.  The right coronary artery is diffusely diseased     proximally at 70% with calcifications noted throughout its course.     This disease extends into the mid segment as well.  There is a 95%     distal stenosis.  The PDA appears to have 40-50% proximal disease,     but difficult to determine the exact stenosis due to overall     underfilling.  STUDY CONCLUSIONS: 1. 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 2. Normal left ventricular systolic function. 3. Mildly elevated left ventricular end-diastolic pressure.  RECOMMENDATIONS:  The patient's coronary anatomy is not suitable for percutaneous coronary intervention.  Coronary artery bypass graft surgery is recommended.  Smoking cessation and treating hyperlipidemia is recommended.     Lorine Bears, MD     MA/MEDQ  D:  08/15/2011  T:  08/15/2011  Job:  [id number]  cc:   Dr. Doyne Keel  Electronically Signed by Lorine Bears MD on 09/04/2011 11:29:57 AM

## 2011-09-06 ENCOUNTER — Other Ambulatory Visit: Payer: Self-pay | Admitting: Surgery

## 2011-09-06 DIAGNOSIS — I2510 Atherosclerotic heart disease of native coronary artery without angina pectoris: Secondary | ICD-10-CM

## 2011-09-07 DIAGNOSIS — E785 Hyperlipidemia, unspecified: Secondary | ICD-10-CM | POA: Insufficient documentation

## 2011-09-07 DIAGNOSIS — Z951 Presence of aortocoronary bypass graft: Secondary | ICD-10-CM

## 2011-09-11 ENCOUNTER — Ambulatory Visit (INDEPENDENT_AMBULATORY_CARE_PROVIDER_SITE_OTHER): Payer: Self-pay | Admitting: Surgery

## 2011-09-11 ENCOUNTER — Ambulatory Visit
Admission: RE | Admit: 2011-09-11 | Discharge: 2011-09-11 | Disposition: A | Discharge status: Home / Self Care | Payer: No Typology Code available for payment source | Source: Ambulatory Visit | Attending: Surgery | Admitting: Surgery

## 2011-09-11 ENCOUNTER — Encounter: Payer: Self-pay | Admitting: Surgery

## 2011-09-11 ENCOUNTER — Ambulatory Visit: Payer: Self-pay | Admitting: Surgery

## 2011-09-11 VITALS — BP 131/83 | HR 81 | Resp 18 | Ht 63.0 in | Wt 155.0 lb

## 2011-09-11 DIAGNOSIS — I2510 Atherosclerotic heart disease of native coronary artery without angina pectoris: Secondary | ICD-10-CM

## 2011-09-11 DIAGNOSIS — Z951 Presence of aortocoronary bypass graft: Secondary | ICD-10-CM

## 2011-09-11 NOTE — Progress Notes (Signed)
  HPI: Patient returns for routine postoperative follow-up having undergone coronary bypass graft surgery x3 on 08/16/2011. The patient's early postoperative recovery while in the hospital was notable for an uncomplicated postoperative course. Since hospital discharge the patient reports she is walking daily without chest pain or shortness of breath. Her energy level is gradually improving. She has been having some chest wall soreness.   Current Outpatient Prescriptions  Medication Sig Dispense Refill  . acetaminophen (TYLENOL) 325 MG tablet Take 650 mg by mouth every 6 (six) hours as needed.        Marland Kitchen aspirin 325 MG tablet Take 325 mg by mouth daily.        . metoprolol tartrate (LOPRESSOR) 25 MG tablet Take 25 mg by mouth 2 (two) times daily. TAKE HALF OF PILL TWICE DAILY       . simvastatin (ZOCOR) 40 MG tablet Take 1 tablet (40 mg total) by mouth every evening.  30 tablet  6  . HYDROmorphone (DILAUDID) 2 MG tablet Take 2 mg by mouth every 4 (four) hours as needed.        . rosuvastatin (CRESTOR) 40 MG tablet Take 40 mg by mouth daily.          Physical Exam: BP 131/83  Pulse 81  Resp 18  Ht 5\' 3"  (1.6 m)  Wt 155 lb (70.308 kg)  BMI 27.46 kg/m2  SpO2 98% She looks well. Cardiac exam shows regular rate and rhythm with normal heart sounds. Lung exam is clear. Chest incision is healing well and the sternum is stable. Her leg incisions healing well and there is no lower extremity edema.   Diagnostic Tests:  Chest x-ray today shows clear lung fields and no pleural effusions.  Impression:  Overall Kelsey Padilla is recovering well following her surgery. I told her she could return to driving a car but should refrain from lifting anything heavier than 10 pounds for a total of 3 months from the date of surgery. She was diagnosed with vitamin B 12 deficiency and megaloblastic anemia when she initially presented with chest discomfort and is now on vitamin B12 injections. She is going to  followup with the [facility] health Department to try to get a primary physician to follow this. She said she has a followup appointment with cardiology and in the near future.  Plan:  She will return to see me if she has any problems with her incisions.

## 2011-09-13 NOTE — Op Note (Signed)
NAMEAJEENAH, HEINY NO.:  [id number]  MEDICAL RECORD NO.:  [id number]  LOCATION:  2314                         FACILITY:  [facility]  PHYSICIAN:  Guadalupe Maple, M.D.  DATE OF BIRTH:  1966-09-13  DATE OF PROCEDURE:  08/16/2011 DATE OF DISCHARGE:                              OPERATIVE REPORT   PROCEDURE:  Intraoperative transesophageal echocardiography.  Ms. Tiffay Pinette is a 45 year old female with a history of smoking and strong family history of heart disease who developed progressively worsening symptoms of chest discomfort and left shoulder, neck, and arm pain.  She was subsequently noted to have severe anemia with marked vitamin B12 deficiency.  She underwent cardiac catheterization and was found to have 70% left main stenosis, 80% with LAD ostial stenosis, and 70% circumflex disease with 70% diffuse circumflex, 70% right coronary artery disease.  She is now scheduled to undergo coronary artery bypass grafting by Dr. Laneta Simmers.  Intraoperative transesophageal echocardiography was requested to evaluate the left and right ventricular function and to serve as a monitor for intraoperative volume status and to determine if any valvular pathology was present.  The patient was brought to the operating room at [facility] and general anesthesia was induced without difficulty.  The trachea was intubated without difficulty.  Following orogastric suctioning, the transesophageal echocardiography probe was then inserted into the esophagus without difficulty.  IMPRESSION:  Prebypass Findings: 1. Mitral valve.  The mitral leaflets were thin and pliable and     coapted normally.  There was no prolapse or flail segments noted.     There was trace mitral insufficiency.  There was mild mitral     annular calcification noted. 2. Aortic valve.  The aortic valve appeared normal.  The leaflets     coapted well.  There was no calcification or significant thickening  of the leaflets noted.  There was no aortic insufficiency. 3. Left ventricle.  There was normal-appearing left ventricular     function.  Left ventricular wall thickness measured 0.9 to 1.0 cm     at end diastole at the mid papillary level.  The left ventricular     end-diastolic diameter measured 4.6 cm at the mid papillary level     in the short axis view.  Left ventricular ejection fraction was     estimated at 60% to 65%.  There were no regional wall motion     abnormalities. 4. Right ventricle.  There was normal-appearing right ventricular     function with good contractility of the right ventricular free     wall. 5. Tricuspid valve.  The tricuspid leaflets appeared structurally     normal with trace tricuspid insufficiency. 6. Interatrial septum.  There was a patent foramen ovale present,     which could clearly be seen with color Doppler.  This appeared to     involve the superior region of the fossa ovalis. 7. Left atrium.  There was no thrombus noted in the left atrium or     left atrial appendage. 8. Ascending aorta.  The ascending aorta was not aneurysmal.  There     was a well-defined sinotubular ridge in aortic root.  9. Descending aorta.  The descending aorta appeared free of     significant atheromatous disease and measured 1.9 cm in diameter.  Postbypass Findings: 1. Left ventricle.  There was normal-appearing left ventricular     function, which appeared unchanged from the prebypass study.  The     ventricle contracted well.  There was ejection fraction estimated     at 60% to 65%. 2. Right ventricle.  There was normal-appearing right ventricular     function with good contractility of the right ventricular free     wall. 3. Mitral valve.  There was normal-appearing mitral valve with mild     annular calcification with trace mitral insufficiency, unchanged     from the prebypass findings. 4. Aortic valve.  The aortic valve again showed no aortic     insufficiency  and the leaflets opened normally. 5. Tricuspid valve.  The tricuspid valve showed trace tricuspid     insufficiency.          ______________________________ Guadalupe Maple, M.D.     DCJ/MEDQ  D:  08/16/2011  T:  08/16/2011  Job:  [id number]  Electronically Signed by Kipp Brood M.D. on 09/13/2011 09:08:06 AM

## 2011-09-14 ENCOUNTER — Ambulatory Visit (INDEPENDENT_AMBULATORY_CARE_PROVIDER_SITE_OTHER): Payer: Self-pay | Admitting: Cardiovascular Disease

## 2011-09-14 ENCOUNTER — Encounter: Payer: Self-pay | Admitting: Cardiovascular Disease

## 2011-09-14 VITALS — BP 123/85 | HR 97 | Ht 63.0 in | Wt 157.0 lb

## 2011-09-14 DIAGNOSIS — F17200 Nicotine dependence, unspecified, uncomplicated: Secondary | ICD-10-CM

## 2011-09-14 DIAGNOSIS — E785 Hyperlipidemia, unspecified: Secondary | ICD-10-CM

## 2011-09-14 DIAGNOSIS — Z720 Tobacco use: Secondary | ICD-10-CM | POA: Insufficient documentation

## 2011-09-14 DIAGNOSIS — E782 Mixed hyperlipidemia: Secondary | ICD-10-CM

## 2011-09-14 DIAGNOSIS — D539 Nutritional anemia, unspecified: Secondary | ICD-10-CM

## 2011-09-14 DIAGNOSIS — Z79899 Other long term (current) drug therapy: Secondary | ICD-10-CM

## 2011-09-14 DIAGNOSIS — I2510 Atherosclerotic heart disease of native coronary artery without angina pectoris: Secondary | ICD-10-CM | POA: Insufficient documentation

## 2011-09-14 NOTE — Assessment & Plan Note (Signed)
She needs a followup CBC to check on her anemia. Continue with monthly vitamin B12 injections for now.

## 2011-09-14 NOTE — Progress Notes (Signed)
HPI  This is a 45 year old female who is here today for followup visit. I saw her last month to [facility] when she presented with chest pain. She was found to be significantly anemic with a hemoglobin of 7.5. She was diagnosed with vitamin B12 deficiency. She had 3 units of blood transfusion and was treated with vitamin B12 injections. Do to have chest pain, she underwent a treadmill stress test which was abnormal with significant ST depression. She was thus referred for cardiac catheterization which showed significant distal left main and three-vessel coronary artery disease. The patient underwent coronary artery bypass graft surgery without complications. She has been doing well since then. She feels much better than before. She denies any dyspnea. She quit smoking after hospital discharge but smokes a few cigarettes over the last few days. The patient has significant stress at home as both her husband and son have bipolar disorder.  Allergies  Allergen Reactions  . Codeine Nausea And Vomiting     Current Outpatient Prescriptions on File Prior to Visit  Medication Sig Dispense Refill  . aspirin 325 MG tablet Take 325 mg by mouth daily.        . metoprolol tartrate (LOPRESSOR) 25 MG tablet Take 12.5 mg by mouth 2 (two) times daily. TAKE HALF OF PILL TWICE DAILY      . simvastatin (ZOCOR) 40 MG tablet Take 1 tablet (40 mg total) by mouth every evening.  30 tablet  6     Past Medical History  Diagnosis Date  . Hyperlipidemia   . 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 Vitamin B12 deficiency   . Anemia, macrocytic   . Tobacco abuse   . Coronary artery disease      Past Surgical History  Procedure Date  . Coronary artery bypass graft   . Cardiac catheterization 07/2011    significnat distal left main and 3 vessel CAD. Normal EF.      History reviewed. No pertinent family history.   History   Social History  . Marital Status: Married    Spouse Name: N/A    Number of  Children: N/A  . Years of Education: N/A   Occupational History  . Not on file.   Social History Main Topics  . Smoking status: Current Some Day Smoker -- 0.8 packs/day for 20 years    Types: Cigarettes    Last Attempt to Quit: 08/16/2011  . Smokeless tobacco: Never Used   Comment: SMOKED 4 CIGARETTES SINCE HOSPITALIZATION  . Alcohol Use: No  . Drug Use: No  . Sexually Active: Not on file   Other Topics Concern  . Not on file   Social History Narrative   Has 15 year old daughter and 95 year old sonHas 1 grandchild 71 mths old      PHYSICAL EXAM   BP 123/85  Pulse 97  Ht 5\' 3"  (1.6 m)  Wt 157 lb (71.215 kg)  BMI 27.81 kg/m2  Constitutional: She is oriented to person, place, and time. She appears well-developed and well-nourished. No distress.  HENT: No nasal discharge.  Head: Normocephalic and atraumatic.  Eyes: Pupils are equal, round, and reactive to light. Right eye exhibits no discharge. Left eye exhibits no discharge.  Neck: Normal range of motion. Neck supple. No JVD present. No thyromegaly present.  Cardiovascular: Normal rate, regular rhythm, normal heart sounds and intact distal pulses. Exam reveals no gallop and no friction rub.  No murmur heard.  Pulmonary/Chest: Effort  normal and breath sounds normal. No stridor. No respiratory distress. She has no wheezes. She has no rales. She exhibits no tenderness.  Abdominal: Soft. Bowel sounds are normal. She exhibits no distension. There is no tenderness. There is no rebound and no guarding.  Musculoskeletal: Normal range of motion. She exhibits no edema and no tenderness.  Neurological: She is alert and oriented to person, place, and time. Coordination normal.  Skin: Skin is warm and dry. No rash noted. She is not diaphoretic. No erythema. No pallor.  Psychiatric: She has a normal mood and affect. Her behavior is normal. Judgment and thought content normal.    EKG: Normal sinus rhythm with no significant ST or T wave  changes.   ASSESSMENT AND PLAN

## 2011-09-14 NOTE — Assessment & Plan Note (Signed)
The patient seems to be doing well at this time. There is no evidence of recurrent angina. Continue aspirin daily, metoprolol and simvastatin. I advised her to increase her physical activities. She is trying to apply for disability and Medicaid. I discussed with her the importance of lifestyle changes including diet and exercise. She is unable to attend cardiac rehabilitation at this time.

## 2011-09-14 NOTE — Assessment & Plan Note (Signed)
Continue treatment with simvastatin. Will request a fasting lipid and liver profile.

## 2011-09-14 NOTE — Patient Instructions (Signed)
Your physician wants you to follow-up in: 3 months. You will receive a reminder letter in the mail one-two months in advance. If you don't receive a letter, please call our office to schedule the follow-up appointment. Your physician recommends that you go to the [facility] or Health Department for lab work: FLP/LFT/CBC. Do not eat or drink after midnight.  If the results of your test are normal or stable, you will receive a letter. If they are abnormal, the nurse will contact you by phone.  Your physician recommends that you continue on your current medications as directed. Please refer to the Current Medication list given to you today.

## 2011-09-14 NOTE — Assessment & Plan Note (Signed)
I advised her to quit smoking. 

## 2011-10-03 ENCOUNTER — Encounter: Payer: Self-pay | Admitting: *Deleted

## 2011-10-03 NOTE — Progress Notes (Signed)
  Letter mailed to pt reminding her to do lab work ordered at 9/28 office visit.

## 2011-10-12 ENCOUNTER — Telehealth: Payer: Self-pay | Admitting: Cardiovascular Disease

## 2011-10-12 NOTE — Telephone Encounter (Signed)
Having pain in her shoulders and she states that the incision from CABG feels like it is pulling. History of arthritis. She has a prescription for Naproxen 500 mg but is afraid to take without permission due to heart problems. She has been taking Tylenol and Ibupreofen but it is not helping.

## 2011-10-12 NOTE — Telephone Encounter (Signed)
Pt states she has arthritis. She thinks the change in weather/dampness is causing her to hurt. She is hurting in her shoulders towards neck and arms are sore. She states this is an arthritis hurting not real pain. She states shoulders only hurt when it's cold or damp.   She has a "pulling" feeling in her chest at incision since surgery. She states it feels like someone just pulling or stretching chest. She states wearing a bra helps this feeling but she can't wear a bra b/c it rubs where the tubes were. She states the incision looks fine. It doesn't look raw or anything.   She states she use to take Naproxen and it helped with arthritis, but she wasn't sure she can take this.

## 2011-10-15 NOTE — Telephone Encounter (Signed)
Pt notified and verbalized understanding.

## 2011-10-15 NOTE — Telephone Encounter (Signed)
It is ok to use Naproxyn as needed for a short period.

## 2011-12-24 ENCOUNTER — Ambulatory Visit (INDEPENDENT_AMBULATORY_CARE_PROVIDER_SITE_OTHER): Payer: Medicaid Other | Admitting: Cardiovascular Disease

## 2011-12-24 ENCOUNTER — Encounter: Payer: Self-pay | Admitting: Cardiovascular Disease

## 2011-12-24 DIAGNOSIS — F17200 Nicotine dependence, unspecified, uncomplicated: Secondary | ICD-10-CM

## 2011-12-24 DIAGNOSIS — Z79899 Other long term (current) drug therapy: Secondary | ICD-10-CM

## 2011-12-24 DIAGNOSIS — I2510 Atherosclerotic heart disease of native coronary artery without angina pectoris: Secondary | ICD-10-CM

## 2011-12-24 DIAGNOSIS — Z720 Tobacco use: Secondary | ICD-10-CM

## 2011-12-24 DIAGNOSIS — E785 Hyperlipidemia, unspecified: Secondary | ICD-10-CM

## 2011-12-24 MED ORDER — ATORVASTATIN CALCIUM 40 MG PO TABS: 40.0000 mg | ORAL_TABLET | Freq: Every day | ORAL | Status: DC

## 2011-12-24 NOTE — Assessment & Plan Note (Signed)
I again discussed the importance of smoking cessation with the patient.

## 2011-12-24 NOTE — Assessment & Plan Note (Signed)
Her lipid profile on simvastatin 40 mg daily showed an LDL of 107, HDL 34 and a triglyceride of 185. I recommend changing simvastatin to atorvastatin 40 mg once daily. Repeat fasting lipid and liver profile in 6 weeks.

## 2011-12-24 NOTE — Assessment & Plan Note (Signed)
The patient is doing reasonably well. She's not having any recurrent symptoms of angina. Continue medical therapy. The patient could not attend cardiac rehabilitation due to family obligations.

## 2011-12-24 NOTE — Progress Notes (Signed)
HPI  This is a 46 year old female who is here today for followup visit. She has known history of coronary artery disease status post coronary artery bypass graft surgery. Other problems include hyperlipidemia, tobacco use and history of macrocytic anemia due to vitamin B12 deficiency. Overall, she has been doing reasonably well. She denies any chest tightness or dyspnea. She is cutting down on smoking but still smokes one fourth of her back per day.  Allergies  Allergen Reactions  . Codeine Nausea And Vomiting     Current Outpatient Prescriptions on File Prior to Visit  Medication Sig Dispense Refill  . aspirin 325 MG tablet Take 325 mg by mouth daily.        . metoprolol tartrate (LOPRESSOR) 25 MG tablet Take 12.5 mg by mouth 2 (two) times daily. TAKE HALF OF PILL TWICE DAILY      . simvastatin (ZOCOR) 40 MG tablet Take 1 tablet (40 mg total) by mouth every evening.  30 tablet  6  . ibuprofen (ADVIL,MOTRIN) 200 MG tablet Take 200 mg by mouth every 6 (six) hours as needed.           Past Medical History  Diagnosis Date  . Hyperlipidemia   . 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 LIMA to LAD, SVG to OM, SVG to RPDA  . Vitamin B12 deficiency   . Anemia, macrocytic   . Tobacco abuse   . Coronary artery disease      Past Surgical History  Procedure Date  . Coronary artery bypass graft   . Cardiac catheterization 07/2011    significnat distal left main and 3 vessel CAD. Normal EF.      No family history on file.   History   Social History  . Marital Status: Married    Spouse Name: N/A    Number of Children: N/A  . Years of Education: N/A   Occupational History  . Not on file.   Social History Main Topics  . Smoking status: Current Some Day Smoker -- 0.8 packs/day for 20 years    Types: Cigarettes    Last Attempt to Quit: 08/16/2011  . Smokeless tobacco: Never Used   Comment: one pack last 4 days  . Alcohol Use: No  . Drug Use: No  . Sexually Active: Not on file    Other Topics Concern  . Not on file   Social History Narrative   Has 85 year old daughter and 87 year old sonHas 1 grandchild 20 mths old     PHYSICAL EXAM   BP 114/72  Pulse 103  Ht 5\' 3"  (1.6 m)  Wt 161 lb 6.4 oz (73.211 kg)  BMI 28.59 kg/m2  Constitutional: She is oriented to person, place, and time. She appears well-developed and well-nourished. No distress.  HENT: No nasal discharge.  Head: Normocephalic and atraumatic.  Eyes: Pupils are equal, round, and reactive to light. Right eye exhibits no discharge. Left eye exhibits no discharge.  Neck: Normal range of motion. Neck supple. No JVD present. No thyromegaly present.  Cardiovascular: Normal rate, regular rhythm, normal heart sounds. Exam reveals no gallop and no friction rub.  No murmur heard.  Pulmonary/Chest: Effort normal and breath sounds normal. No stridor. No respiratory distress. She has no wheezes. She has no rales. She exhibits no tenderness.  Abdominal: Soft. Bowel sounds are normal. She exhibits no distension. There is no tenderness. There is no rebound and no guarding.  Musculoskeletal: Normal range of motion. She exhibits  no edema and no tenderness.  Neurological: She is alert and oriented to person, place, and time. Coordination normal.  Skin: Skin is warm and dry. No rash noted. She is not diaphoretic. No erythema. No pallor.  Psychiatric: She has a normal mood and affect. Her behavior is normal. Judgment and thought content normal.      ASSESSMENT AND PLAN

## 2011-12-24 NOTE — Patient Instructions (Signed)
Your physician wants you to follow-up in: 6 months. You will receive a reminder letter in the mail one-two months in advance. If you don't receive a letter, please call our office to schedule the follow-up appointment. Stop Zocor (simvastatin). Start Lipitor (atorvastatin) 40 mg Take 1 tablet by mouth every night.  Your physician recommends that you go to the [facility] for a FASTING lipid profile and liver function labs. Do not eat or drink after midnight. DO LAB IN 6 WEEKS AROUND 02/04/2012. If the results of your test are normal or stable, you will receive a letter. If they are abnormal, the nurse will contact you by phone.

## 2012-02-05 ENCOUNTER — Encounter: Payer: Self-pay | Admitting: *Deleted

## 2012-02-12 ENCOUNTER — Telehealth: Payer: Self-pay | Admitting: *Deleted

## 2012-02-12 NOTE — Telephone Encounter (Signed)
Message copied by Eustace Moore on Tue Feb 12, 2012  4:07 PM ------      Message from: Lorine Bears A      Created: Fri Feb 08, 2012  4:45 PM       Inform the patient that her cholesterol is still high. She needs to improve her diet and make sure she is taking Atorvastatin everyday.

## 2012-02-12 NOTE — Telephone Encounter (Signed)
Patient informed. 

## 2012-03-31 ENCOUNTER — Telehealth: Payer: Self-pay | Admitting: *Deleted

## 2012-03-31 MED ORDER — SIMVASTATIN 40 MG PO TABS: 40.0000 mg | ORAL_TABLET | Freq: Every evening | ORAL | Status: DC

## 2012-03-31 NOTE — Telephone Encounter (Signed)
She can stay on Simvastatin.

## 2012-03-31 NOTE — Telephone Encounter (Signed)
Pt states she started Lipitor a few months ago. She states she's been having headaches everyday and pain her legs. She states she stopped Lipitor 3 days ago and feels much better. She has now restarted the simvastatin as previously prescribed.

## 2012-03-31 NOTE — Telephone Encounter (Signed)
Patient called and states that she went off of the Lipitor (atorvastatin) 40 mg three days ago. States it makes Her legs feel weak and she was having a bad headache. Went back on Simbastatin 40mg  and she is feeling a lot Better.  [facility].

## 2012-04-01 MED ORDER — SIMVASTATIN 40 MG PO TABS: 40.0000 mg | ORAL_TABLET | Freq: Every evening | ORAL | Status: DC

## 2012-04-01 NOTE — Telephone Encounter (Signed)
Pt notified and verbalized understanding.

## 2012-05-13 IMAGING — CR DG CHEST 2V
2 series · 2 of 2 positions shown · non-contrast
Comparison: 08/20/2011

CLINICAL DATA: Coronary artery disease.  4 weeks status post CABG.

CHEST - 2 VIEW

[w chest pa]
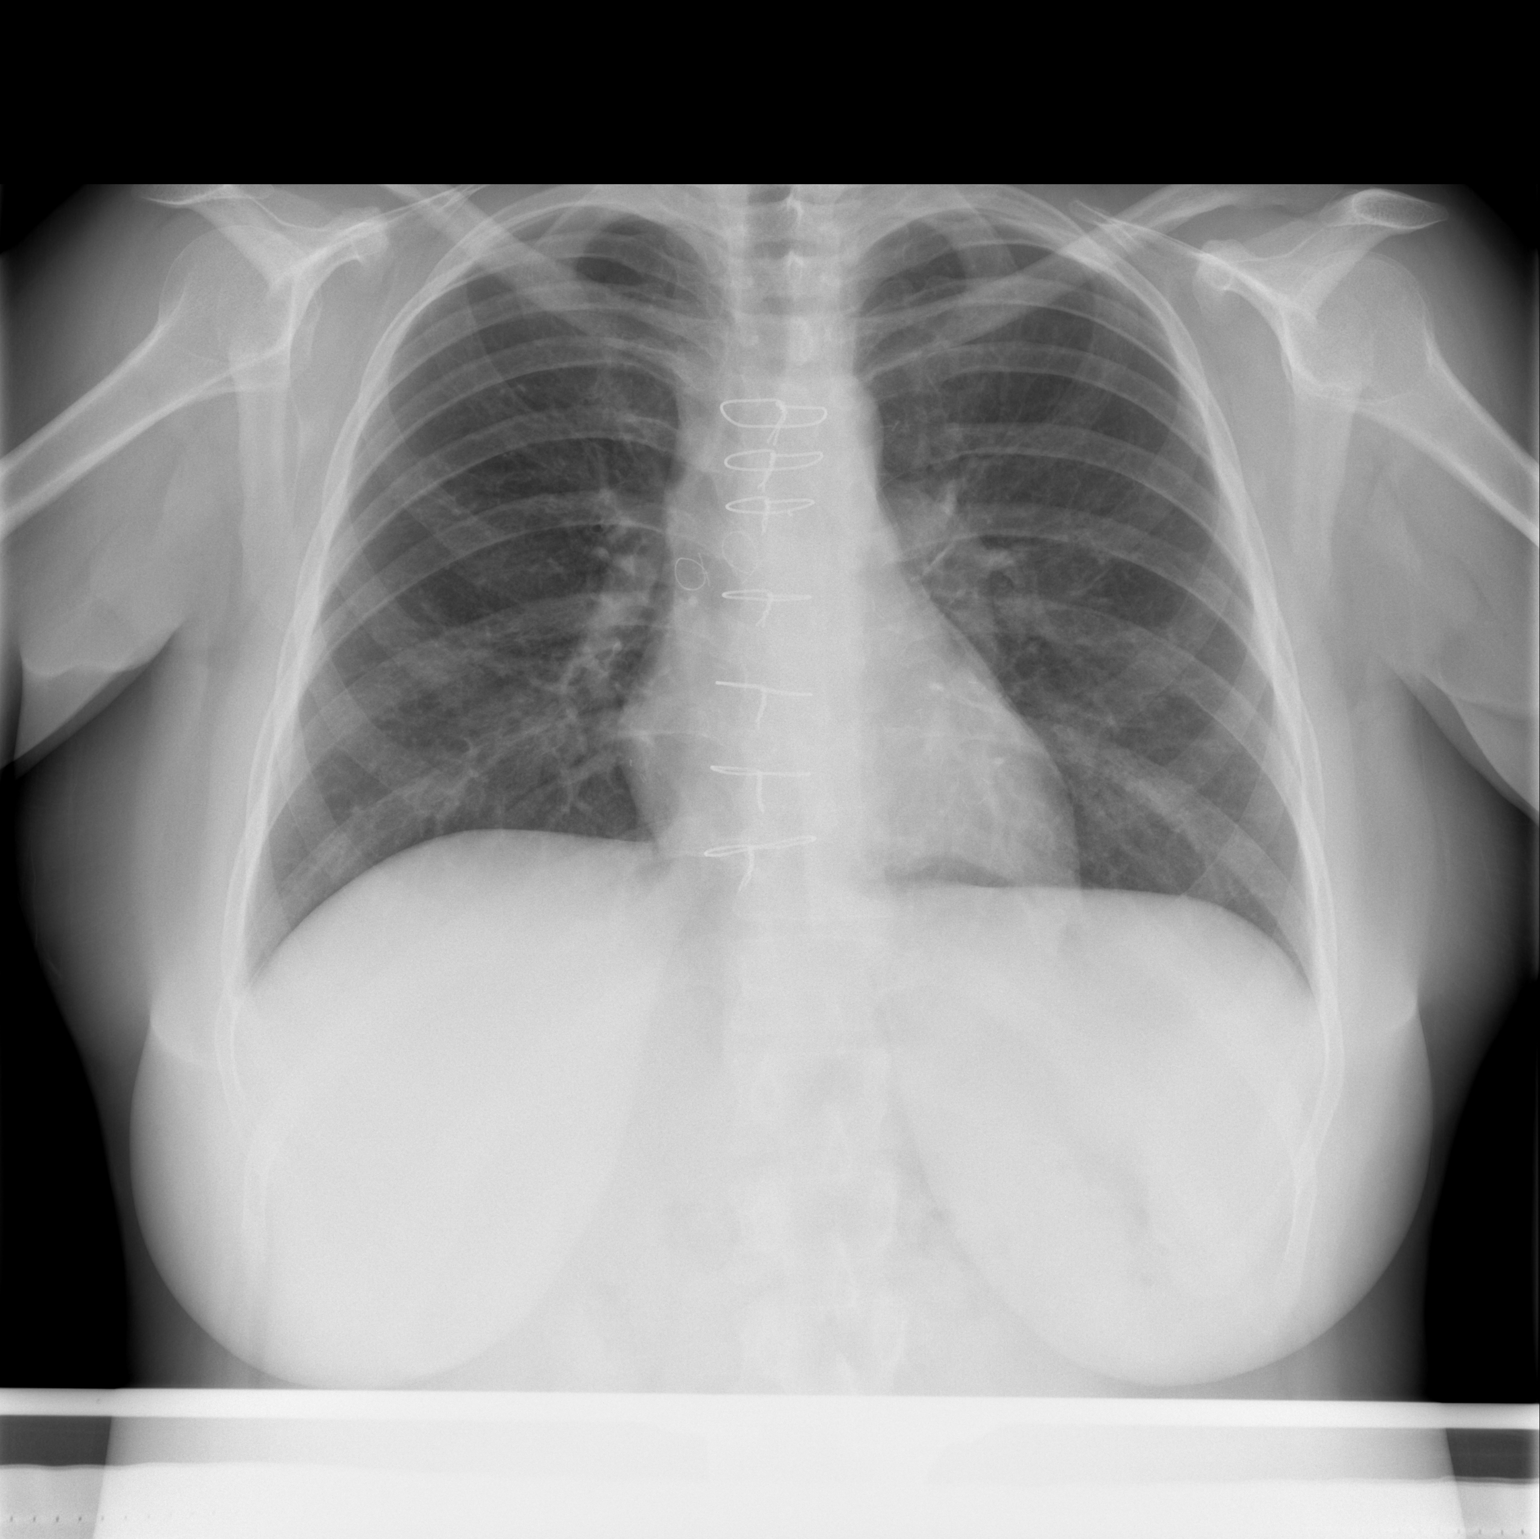

[w chest lat]
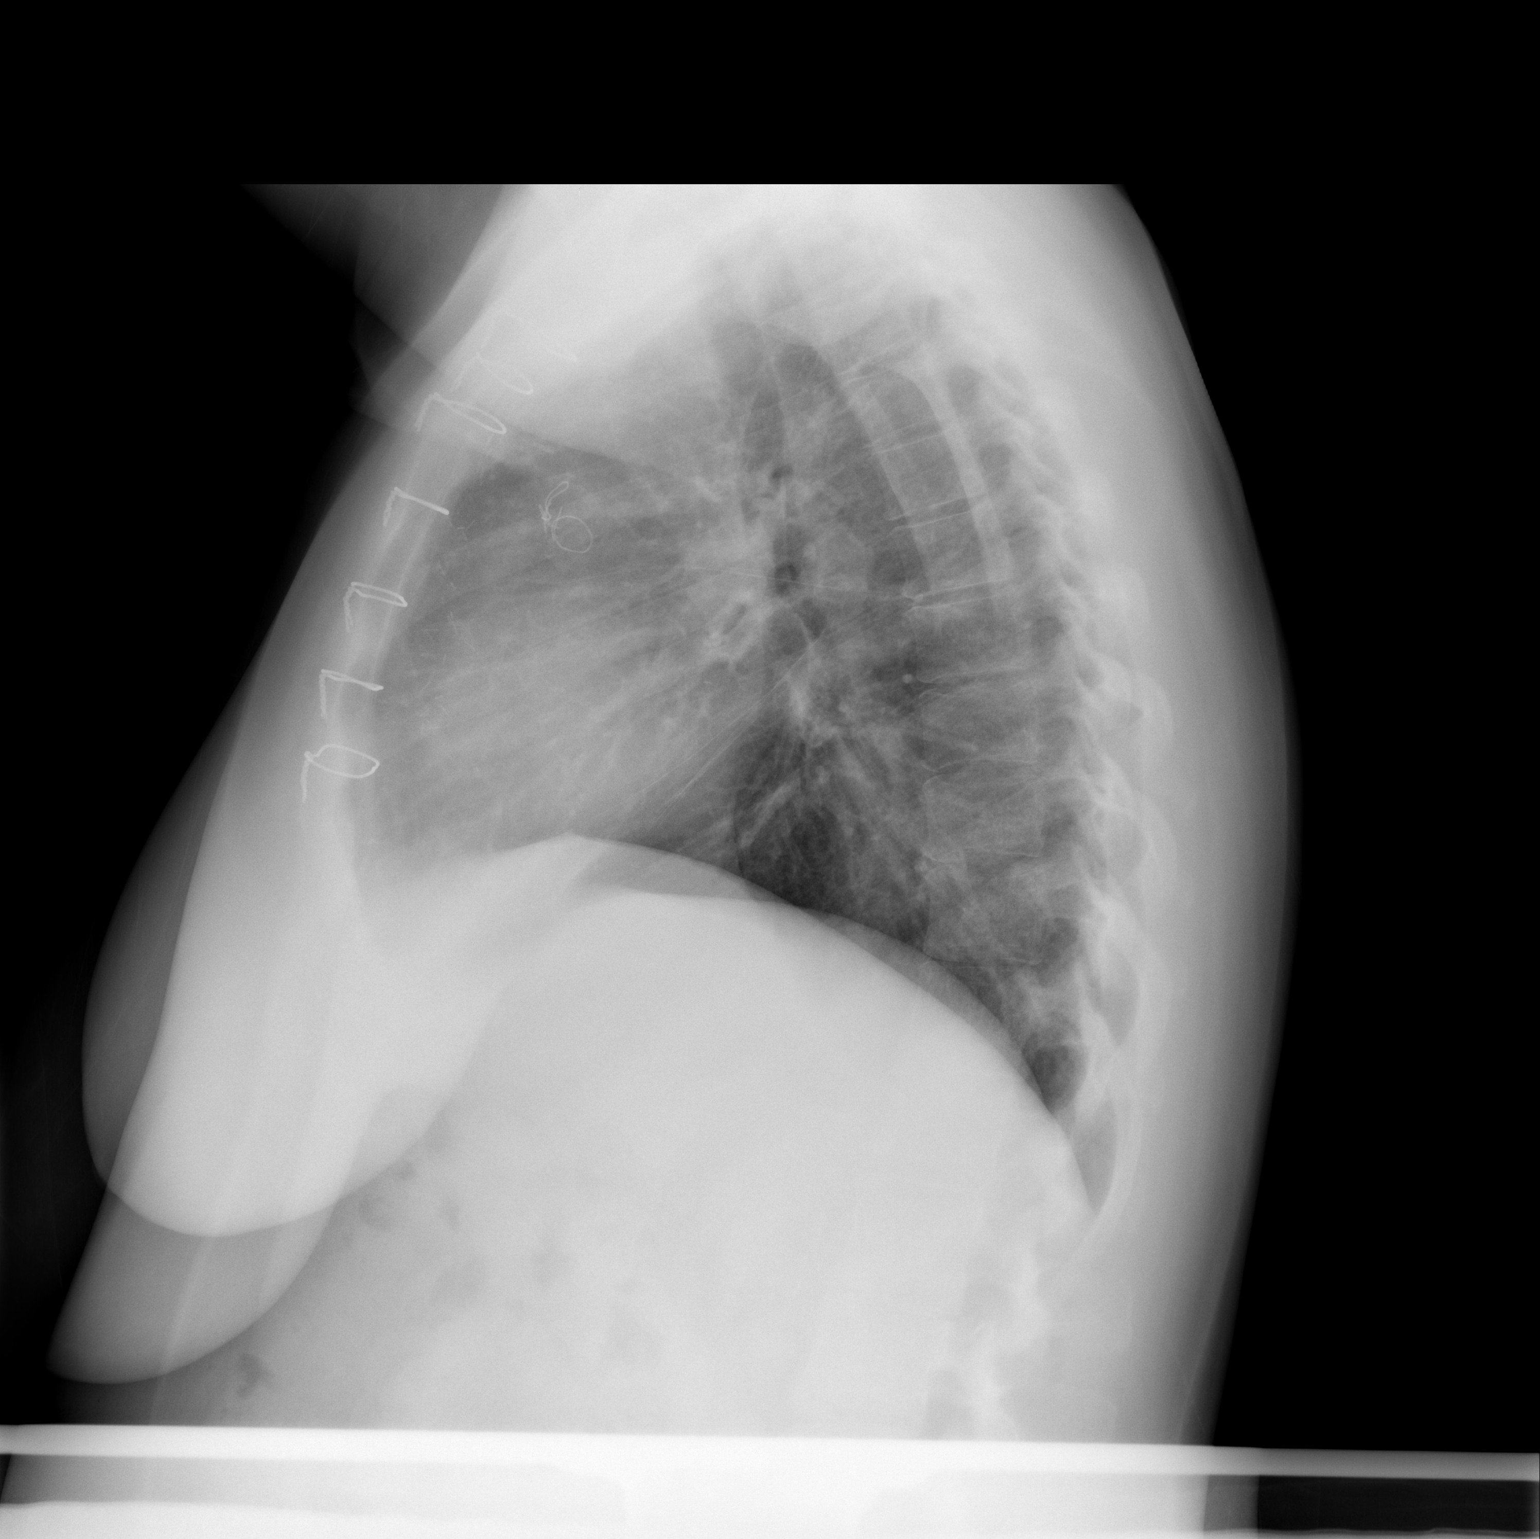

[2 of 2 positions shown; findings below may reference images not displayed]

FINDINGS: Heart size and vascularity are normal.  Effusions and
atelectasis present on the prior study have completely resolved.
Lungs are clear.  No osseous abnormality.  Evidence of CABG.
IMPRESSION: No acute abnormality.  Resolution of effusions and atelectasis.

## 2012-06-06 ENCOUNTER — Telehealth: Payer: Self-pay | Admitting: Cardiology

## 2012-06-06 NOTE — Telephone Encounter (Signed)
Patient will be having a tooth pulled and wants to know how, if at all, she should adjust her medications.

## 2012-06-09 NOTE — Telephone Encounter (Signed)
Patient can hold ASA prior to dental surgery

## 2012-06-09 NOTE — Telephone Encounter (Signed)
Patient called to inquire about her heart medications and whether or not she should go off them before tooth extraction. Nurse advised patient that since she wasn't taking any coumadin or plavix that her dentist could make this decision and she was informed by her dentist Eduard Clos) to ask her cardiology office to determine if she should go off of her aspirin before tooth extraction. Please advise.

## 2012-06-10 NOTE — Telephone Encounter (Signed)
Patient informed and verbalized understanding of plan. 

## 2012-07-21 ENCOUNTER — Ambulatory Visit (INDEPENDENT_AMBULATORY_CARE_PROVIDER_SITE_OTHER): Payer: Medicaid Other | Admitting: Cardiology

## 2012-07-21 ENCOUNTER — Encounter: Payer: Self-pay | Admitting: Cardiology

## 2012-07-21 ENCOUNTER — Other Ambulatory Visit: Payer: Self-pay | Admitting: *Deleted

## 2012-07-21 VITALS — BP 137/80 | HR 85 | Ht 63.0 in | Wt 170.0 lb

## 2012-07-21 DIAGNOSIS — F17200 Nicotine dependence, unspecified, uncomplicated: Secondary | ICD-10-CM

## 2012-07-21 DIAGNOSIS — Z720 Tobacco use: Secondary | ICD-10-CM

## 2012-07-21 DIAGNOSIS — M25579 Pain in unspecified ankle and joints of unspecified foot: Secondary | ICD-10-CM

## 2012-07-21 DIAGNOSIS — E785 Hyperlipidemia, unspecified: Secondary | ICD-10-CM

## 2012-07-21 DIAGNOSIS — I2510 Atherosclerotic heart disease of native coronary artery without angina pectoris: Secondary | ICD-10-CM

## 2012-07-21 MED ORDER — ROSUVASTATIN CALCIUM 20 MG PO TABS: 20.0000 mg | ORAL_TABLET | Freq: Every day | ORAL | Status: DC

## 2012-07-21 NOTE — Patient Instructions (Addendum)
Stop Taking Zocor (simvastatin).  Start Crestor 20mg  everyday.  Have your L foot x-rayed at [facility].  Your physician recommends that you go to [facility] for fasting lab work in 10 weeks.  Lipid Panel  Your physician wants you to follow-up in: 6 months with Dr. Antoine Poche.  You will receive a reminder letter in the mail two months in advance. If you don't receive a letter, please call our office to schedule the follow-up appointment.

## 2012-07-21 NOTE — Addendum Note (Signed)
Addended by: Waymon Budge on: 07/21/2012 04:16 PM   Modules accepted: Orders

## 2012-07-21 NOTE — Assessment & Plan Note (Signed)
The patient has no new sypmtoms.  No further cardiovascular testing is indicated.  We will continue with aggressive risk reduction and meds as listed.  

## 2012-07-21 NOTE — Assessment & Plan Note (Signed)
We discussed a specific strategy for tobacco cessation.  (Greater than three minutes discussing tobacco cessation.)  She agrees to having a quit date.

## 2012-07-21 NOTE — Progress Notes (Signed)
   HPI The patient presents for followup of her known coronary disease. Since she was last seen she has had no new cardiovascular problems. In particular she denies any of the chest discomfort that she had previously. She is unfortunately not walking as much as she should. She thinks she hurt her foot. She twisted it or turned it on slippery ground. This was 3 weeks ago and there is some swelling and tenderness still in the dorsum near the ankle. She denies any shortness of breath, PND or orthopnea. She's had no palpitations, presyncope or syncope. She has no weight gain or edema.  Allergies  Allergen Reactions  . Codeine Nausea And Vomiting    Current Outpatient Prescriptions  Medication Sig Dispense Refill  . aspirin 325 MG tablet Take 325 mg by mouth daily.        . Cyanocobalamin (VITAMIN B-12 IJ) Inject 1 mL as directed every 30 (thirty) days.        Marland Kitchen ibuprofen (ADVIL,MOTRIN) 200 MG tablet Take 200 mg by mouth every 6 (six) hours as needed.        . metoprolol tartrate (LOPRESSOR) 25 MG tablet Take 12.5 mg by mouth 2 (two) times daily. TAKE HALF OF PILL TWICE DAILY      . nitroGLYCERIN (NITROSTAT) 0.4 MG SL tablet Place 0.4 mg under the tongue every 5 (five) minutes as needed.        . simvastatin (ZOCOR) 40 MG tablet Take 1 tablet (40 mg total) by mouth every evening.  30 tablet  6    Past Medical History  Diagnosis Date  . Hyperlipidemia   . 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 LIMA to LAD, SVG to OM, SVG to RPDA  . Vitamin B12 deficiency   . Anemia, macrocytic   . Tobacco abuse   . Coronary artery disease     Past Surgical History  Procedure Date  . Coronary artery bypass graft   . Cardiac catheterization 07/2011    significnat distal left main and 3 vessel CAD. Normal EF.     ROS:  As stated in the HPI and negative for all other systems.  PHYSICAL EXAM BP 137/80  Pulse 85  Ht 5\' 3"  (1.6 m)  Wt 170 lb (77.111 kg)  BMI 30.11 kg/m2 GENERAL:  Well appearing HEENT:   Pupils equal round and reactive, fundi not visualized, oral mucosa unremarkable NECK:  No jugular venous distention, waveform within normal limits, carotid upstroke brisk and symmetric, no bruits, no thyromegaly LYMPHATICS:  No cervical, inguinal adenopathy LUNGS:  Clear to auscultation bilaterally BACK:  No CVA tenderness CHEST:  Well healed sternotomy scar. HEART:  PMI not displaced or sustained,S1 and S2 within normal limits, no S3, no S4, no clicks, no rubs, no murmurs ABD:  Flat, positive bowel sounds normal in frequency in pitch, no bruits, no rebound, no guarding, no midline pulsatile mass, no hepatomegaly, no splenomegaly EXT:  2 plus pulses throughout, no edema, no cyanosis no clubbing, there is some mild swelling and point tenderness in the lateral dorsum of her foot near her ankle. SKIN:  No rashes no nodules NEURO:  Cranial nerves II through XII grossly intact, motor grossly intact throughout PSYCH:  Cognitively intact, oriented to person place and time  EKG:  Sinus rhythm, rate 85, axis within normal limits, intervals within normal limits, no acute ST-T wave changes. 07/21/2012  ASSESSMENT AND PLAN

## 2012-07-21 NOTE — Assessment & Plan Note (Signed)
I will obtain an x-ray and she does have some swelling and continued tenderness 3 weeks after injury.

## 2012-07-21 NOTE — Assessment & Plan Note (Signed)
I reviewed recent lipids with an LDL of 106 which is down from 128. Her total cholesterol is 168 down from 196. However, her HDL is also down to 29 from 39. I would like for her LDL to be in the 70s so I will switch her to Crestor 20 mg daily. She could not tolerate atorvastatin.

## 2012-07-23 ENCOUNTER — Telehealth: Payer: Self-pay | Admitting: *Deleted

## 2012-07-23 NOTE — Telephone Encounter (Signed)
Message copied by Lesle Chris on Wed Jul 23, 2012  2:20 PM ------      Message from: Rollene Rotunda      Created: Tue Jul 22, 2012  2:30 PM       No evidence of fracture.   Call Ms. Vohra with the results and send results.

## 2012-07-23 NOTE — Telephone Encounter (Signed)
Notes Recorded by Lesle Chris, LPN on 12/22/1094 at 2:20 PM Patient notified and verbalized understanding. Will mail copy of results to patient as she may need in the future. She does not have PMD, but uses the HD. If symptoms do not improve, advised to seek evaluation at urgent care or back at the health department. She verbalized understanding.

## 2012-08-20 ENCOUNTER — Telehealth: Payer: Self-pay | Admitting: Cardiology

## 2012-08-20 NOTE — Telephone Encounter (Signed)
Nurse informed patient to break tablet in 1/2 and give a couple of weeks to see if symptoms improve. Patient verbalized understanding of plan.

## 2012-08-20 NOTE — Telephone Encounter (Signed)
Called number listed and received no answer. Will retry later.

## 2012-08-20 NOTE — Telephone Encounter (Signed)
CRESTOR IS BOTHERING HER LEGS LIKE LIPITOR DID.

## 2012-09-04 ENCOUNTER — Other Ambulatory Visit: Payer: Self-pay | Admitting: *Deleted

## 2012-09-08 ENCOUNTER — Other Ambulatory Visit: Payer: Self-pay | Admitting: *Deleted

## 2012-09-08 MED ORDER — CYANOCOBALAMIN 1000 MCG/ML IJ SOLN: 1000.0000 ug | Freq: Once | INTRAMUSCULAR | Status: DC

## 2012-09-08 NOTE — Telephone Encounter (Signed)
Per Dr. Antoine Poche, its okay to refill b12 injection. Nurse informed patient that levels should be monitored and she stated that she was going to health department next month and would have them follow this from now on.

## 2012-10-07 ENCOUNTER — Telehealth: Payer: Self-pay | Admitting: *Deleted

## 2012-10-07 DIAGNOSIS — Z79899 Other long term (current) drug therapy: Secondary | ICD-10-CM

## 2012-10-07 DIAGNOSIS — E785 Hyperlipidemia, unspecified: Secondary | ICD-10-CM

## 2012-10-07 NOTE — Telephone Encounter (Signed)
If she is willing to continue the 10 mg Crestor just keep her on this.

## 2012-10-07 NOTE — Telephone Encounter (Signed)
Message copied by Eustace Moore on Tue Oct 07, 2012  3:54 PM ------      Message from: Rollene Rotunda      Created: Sun Oct 05, 2012  8:14 PM       Increase Crestor to 20 mg daily and repeat Lipid profile and liver enzymes in 8 weeks.  Call Ms. Thatch with the results.

## 2012-10-07 NOTE — Telephone Encounter (Signed)
Patient informed and said she was on this previous and it caused leg pain. Patient's dose was decrease a month ago to 10 mg and is still experiencing leg pain. Patient said she did take zocor 40 mg in the past that didn't bother her legs but was ineffective in helping lipid profile. Please advise.

## 2012-10-09 ENCOUNTER — Other Ambulatory Visit: Payer: Self-pay | Admitting: *Deleted

## 2012-10-09 DIAGNOSIS — E785 Hyperlipidemia, unspecified: Secondary | ICD-10-CM

## 2012-10-09 DIAGNOSIS — Z79899 Other long term (current) drug therapy: Secondary | ICD-10-CM

## 2012-10-09 NOTE — Telephone Encounter (Signed)
Patient informed and is willing to stay on crestor 10 mg. Orders faxed to [facility] lab. Patient doesn't want new prescription sent to pharmacy and will continue breaking 20 mg tablet in 1/2.

## 2012-11-27 ENCOUNTER — Other Ambulatory Visit: Payer: Self-pay | Admitting: Cardiology

## 2012-11-27 MED ORDER — METOPROLOL TARTRATE 25 MG PO TABS: 12.5000 mg | ORAL_TABLET | Freq: Two times a day (BID) | ORAL | Status: DC

## 2012-12-08 ENCOUNTER — Telehealth: Payer: Self-pay | Admitting: *Deleted

## 2012-12-08 ENCOUNTER — Encounter: Payer: Self-pay | Admitting: *Deleted

## 2012-12-08 DIAGNOSIS — E785 Hyperlipidemia, unspecified: Secondary | ICD-10-CM

## 2012-12-08 DIAGNOSIS — Z79899 Other long term (current) drug therapy: Secondary | ICD-10-CM

## 2012-12-08 MED ORDER — ROSUVASTATIN CALCIUM 20 MG PO TABS: 20.0000 mg | ORAL_TABLET | Freq: Every day | ORAL | Status: DC

## 2012-12-08 NOTE — Telephone Encounter (Signed)
This encounter was created in error - please disregard.

## 2012-12-08 NOTE — Telephone Encounter (Signed)
Message copied by Eustace Moore on Mon Dec 08, 2012 11:02 AM ------      Message from: Rollene Rotunda      Created: Sun Dec 07, 2012  3:57 PM       She should increase her Crestor to 40 mg daily and repeat a lipid and liver in 8 weeks.  Call [name] with the results

## 2012-12-08 NOTE — Telephone Encounter (Signed)
Patient informed and said she never started the 20 mg and was still taking 10 mg. Patient agreed to start 20 mg daily saying that she still has the leg pain even when she came off the statin. Patient will go around 02/02/13 for fasting labs at [facility] lab. Lab order faxed and new prescription sent to pharmacy.

## 2013-02-13 ENCOUNTER — Encounter: Payer: Self-pay | Admitting: Cardiology

## 2013-02-13 ENCOUNTER — Ambulatory Visit (INDEPENDENT_AMBULATORY_CARE_PROVIDER_SITE_OTHER): Payer: Medicaid Other | Admitting: Cardiology

## 2013-02-13 VITALS — BP 122/87 | HR 98 | Ht 63.0 in | Wt 172.8 lb

## 2013-02-13 DIAGNOSIS — I2510 Atherosclerotic heart disease of native coronary artery without angina pectoris: Secondary | ICD-10-CM

## 2013-02-13 NOTE — Patient Instructions (Addendum)
Your physician recommends that you schedule a follow-up appointment in: 1 year. You will receive a reminder letter in the mail in about 10 months reminding you to call and schedule your appointment. If you don't receive this letter, please contact our office. Your physician has recommended you make the following change in your medication: decrease aspirin to 81 mg daily. Decrease crestor to 10 mg daily. All other medications will remain the same.

## 2013-02-13 NOTE — Progress Notes (Signed)
   HPI The patient presents for followup of her known coronary disease. Since she was last seen she has had no new cardiovascular problems. In particular she denies any of the chest discomfort that she had previously. She doesn't walk during the winter but she keeps active with a 45 year old son.  She denies any shortness of breath, PND or orthopnea. She's had no palpitations, presyncope or syncope. She has no weight gain or edema.  She continues to smoke.   Allergies  Allergen Reactions  . Codeine Nausea And Vomiting    Current Outpatient Prescriptions  Medication Sig Dispense Refill  . aspirin 325 MG tablet Take 325 mg by mouth daily.        . metoprolol tartrate (LOPRESSOR) 25 MG tablet Take 0.5 tablets (12.5 mg total) by mouth 2 (two) times daily. TAKE HALF OF PILL TWICE DAILY  30 tablet  3  . rosuvastatin (CRESTOR) 20 MG tablet Take 1 tablet (20 mg total) by mouth daily.  30 tablet  3  . ibuprofen (ADVIL,MOTRIN) 200 MG tablet Take 200 mg by mouth every 6 (six) hours as needed.        . nitroGLYCERIN (NITROSTAT) 0.4 MG SL tablet Place 0.4 mg under the tongue every 5 (five) minutes as needed.        . pseudoephedrine (SUDAFED) 30 MG tablet Take 30 mg by mouth as needed for congestion.       No current facility-administered medications for this visit.    Past Medical History  Diagnosis Date  . Hyperlipidemia   . 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 LIMA to LAD, SVG to OM, SVG to RPDA  . Vitamin B12 deficiency   . Anemia, macrocytic   . Tobacco abuse   . Coronary artery disease     Past Surgical History  Procedure Laterality Date  . Coronary artery bypass graft    . Cardiac catheterization  07/2011    significnat distal left main and 3 vessel CAD. Normal EF.     ROS:  As stated in the HPI and negative for all other systems.  PHYSICAL EXAM BP 122/87  Pulse 98  Ht 5\' 3"  (1.6 m)  Wt 172 lb 12.8 oz (78.382 kg)  BMI 30.62 kg/m2  SpO2 98% GENERAL:  Well appearing NECK:   No jugular venous distention, waveform within normal limits, carotid upstroke brisk and symmetric, no bruits, no thyromegaly LUNGS:  Clear to auscultation bilaterally CHEST:  Well healed sternotomy scar. HEART:  PMI not displaced or sustained,S1 and S2 within normal limits, no S3, no S4, no clicks, no rubs, no murmurs ABD:  Flat, positive bowel sounds normal in frequency in pitch, no bruits, no rebound, no guarding, no midline pulsatile mass, no hepatomegaly, no splenomegaly EXT:  2 plus pulses throughout, no edema, no cyanosis no clubbing, there is some mild swelling and point tenderness in the lateral dorsum of her foot near her ankle.   ASSESSMENT AND PLAN  CAD The patient has no new sypmtoms.  No further cardiovascular testing is indicated.  We will continue with aggressive risk reduction and meds as listed.  She can change to an 81 mg ASA.   HYPERLIPIDEMIA She is going to cut her Crestor in half as she does not tolerate the higher dose.  TOBACCO ABUSE We discussed a specific strategy for tobacco cessation. She says that stress keeps her smoking.

## 2013-02-24 ENCOUNTER — Encounter: Payer: Self-pay | Admitting: Cardiology

## 2013-06-04 ENCOUNTER — Other Ambulatory Visit: Payer: Self-pay | Admitting: Cardiology

## 2013-07-22 ENCOUNTER — Other Ambulatory Visit: Payer: Self-pay | Admitting: Cardiology

## 2013-08-04 ENCOUNTER — Telehealth: Payer: Self-pay | Admitting: *Deleted

## 2013-08-04 DIAGNOSIS — Z79899 Other long term (current) drug therapy: Secondary | ICD-10-CM

## 2013-08-04 DIAGNOSIS — E785 Hyperlipidemia, unspecified: Secondary | ICD-10-CM

## 2013-08-04 NOTE — Telephone Encounter (Signed)
Patient lost her insurance and can't afford crestor anymore. Please change to a more affordable statin. Patient can't take lipitor due to it causing severe leg cramping. Patient has taken zocor 40 mg and didn't have any problems. Patient has 1 week worth left.

## 2013-08-05 MED ORDER — SIMVASTATIN 40 MG PO TABS: 40.0000 mg | ORAL_TABLET | Freq: Every day | ORAL | Status: DC

## 2013-08-05 NOTE — Telephone Encounter (Signed)
Ok to start Zocor 40 mg po qhs.  Disp number 31 with 11 refills.  She needs a lipid profile and liver enzymes in 8 weeks.

## 2013-08-05 NOTE — Telephone Encounter (Signed)
Patient informed. Lab order faxed to [facility]. 

## 2014-08-20 ENCOUNTER — Other Ambulatory Visit: Payer: Self-pay | Admitting: Cardiology

## 2014-09-08 ENCOUNTER — Other Ambulatory Visit (HOSPITAL_COMMUNITY): Payer: Self-pay | Admitting: *Deleted

## 2014-09-08 DIAGNOSIS — Z1231 Encounter for screening mammogram for malignant neoplasm of breast: Secondary | ICD-10-CM

## 2014-09-13 ENCOUNTER — Ambulatory Visit (HOSPITAL_COMMUNITY): Payer: Medicaid Other

## 2018-02-28 ENCOUNTER — Encounter (HOSPITAL_COMMUNITY): Payer: Self-pay | Admitting: Emergency Medicine

## 2018-02-28 ENCOUNTER — Inpatient Hospital Stay (HOSPITAL_COMMUNITY)
Admission: EM | Disposition: A | Discharge status: Home / Self Care | Payer: Self-pay | Source: Home / Self Care | Attending: Interventional Cardiology

## 2018-02-28 ENCOUNTER — Emergency Department (HOSPITAL_COMMUNITY): Payer: Medicaid Other

## 2018-02-28 ENCOUNTER — Ambulatory Visit (HOSPITAL_COMMUNITY): Admit: 2018-02-28 | Payer: Self-pay | Admitting: Interventional Cardiology

## 2018-02-28 ENCOUNTER — Inpatient Hospital Stay (HOSPITAL_COMMUNITY): Payer: Medicaid Other

## 2018-02-28 ENCOUNTER — Inpatient Hospital Stay (HOSPITAL_COMMUNITY)
Admission: EM | Admit: 2018-02-28 | Discharge: 2018-03-06 | DRG: 270 | Disposition: A | Discharge status: Home / Self Care | Payer: Medicaid Other | Attending: Cardiology | Admitting: Cardiology

## 2018-02-28 ENCOUNTER — Other Ambulatory Visit: Payer: Self-pay

## 2018-02-28 DIAGNOSIS — E7800 Pure hypercholesterolemia, unspecified: Secondary | ICD-10-CM | POA: Diagnosis not present

## 2018-02-28 DIAGNOSIS — E785 Hyperlipidemia, unspecified: Secondary | ICD-10-CM | POA: Diagnosis present

## 2018-02-28 DIAGNOSIS — I493 Ventricular premature depolarization: Secondary | ICD-10-CM | POA: Diagnosis not present

## 2018-02-28 DIAGNOSIS — J189 Pneumonia, unspecified organism: Secondary | ICD-10-CM

## 2018-02-28 DIAGNOSIS — Z791 Long term (current) use of non-steroidal anti-inflammatories (NSAID): Secondary | ICD-10-CM | POA: Diagnosis not present

## 2018-02-28 DIAGNOSIS — R0602 Shortness of breath: Secondary | ICD-10-CM

## 2018-02-28 DIAGNOSIS — R0600 Dyspnea, unspecified: Secondary | ICD-10-CM

## 2018-02-28 DIAGNOSIS — Z79899 Other long term (current) drug therapy: Secondary | ICD-10-CM

## 2018-02-28 DIAGNOSIS — I2129 ST elevation (STEMI) myocardial infarction involving other sites: Secondary | ICD-10-CM | POA: Diagnosis not present

## 2018-02-28 DIAGNOSIS — E876 Hypokalemia: Secondary | ICD-10-CM | POA: Diagnosis present

## 2018-02-28 DIAGNOSIS — E871 Hypo-osmolality and hyponatremia: Secondary | ICD-10-CM | POA: Diagnosis not present

## 2018-02-28 DIAGNOSIS — Z720 Tobacco use: Secondary | ICD-10-CM | POA: Diagnosis present

## 2018-02-28 DIAGNOSIS — I213 ST elevation (STEMI) myocardial infarction of unspecified site: Secondary | ICD-10-CM | POA: Diagnosis present

## 2018-02-28 DIAGNOSIS — I5041 Acute combined systolic (congestive) and diastolic (congestive) heart failure: Secondary | ICD-10-CM | POA: Diagnosis present

## 2018-02-28 DIAGNOSIS — Z7982 Long term (current) use of aspirin: Secondary | ICD-10-CM | POA: Diagnosis not present

## 2018-02-28 DIAGNOSIS — Z885 Allergy status to narcotic agent status: Secondary | ICD-10-CM

## 2018-02-28 DIAGNOSIS — I25810 Atherosclerosis of coronary artery bypass graft(s) without angina pectoris: Secondary | ICD-10-CM

## 2018-02-28 DIAGNOSIS — G8929 Other chronic pain: Secondary | ICD-10-CM | POA: Diagnosis present

## 2018-02-28 DIAGNOSIS — Z4509 Encounter for adjustment and management of other cardiac device: Secondary | ICD-10-CM

## 2018-02-28 DIAGNOSIS — K5900 Constipation, unspecified: Secondary | ICD-10-CM | POA: Diagnosis present

## 2018-02-28 DIAGNOSIS — F17210 Nicotine dependence, cigarettes, uncomplicated: Secondary | ICD-10-CM | POA: Diagnosis present

## 2018-02-28 DIAGNOSIS — E538 Deficiency of other specified B group vitamins: Secondary | ICD-10-CM | POA: Diagnosis present

## 2018-02-28 DIAGNOSIS — Z888 Allergy status to other drugs, medicaments and biological substances status: Secondary | ICD-10-CM

## 2018-02-28 DIAGNOSIS — D72829 Elevated white blood cell count, unspecified: Secondary | ICD-10-CM | POA: Diagnosis present

## 2018-02-28 DIAGNOSIS — Z951 Presence of aortocoronary bypass graft: Secondary | ICD-10-CM | POA: Diagnosis not present

## 2018-02-28 DIAGNOSIS — R570 Cardiogenic shock: Secondary | ICD-10-CM | POA: Diagnosis not present

## 2018-02-28 DIAGNOSIS — I959 Hypotension, unspecified: Secondary | ICD-10-CM | POA: Diagnosis present

## 2018-02-28 DIAGNOSIS — Z955 Presence of coronary angioplasty implant and graft: Secondary | ICD-10-CM

## 2018-02-28 DIAGNOSIS — I2121 ST elevation (STEMI) myocardial infarction involving left circumflex coronary artery: Secondary | ICD-10-CM | POA: Diagnosis not present

## 2018-02-28 DIAGNOSIS — R339 Retention of urine, unspecified: Secondary | ICD-10-CM | POA: Diagnosis present

## 2018-02-28 DIAGNOSIS — M549 Dorsalgia, unspecified: Secondary | ICD-10-CM | POA: Diagnosis present

## 2018-02-28 DIAGNOSIS — I509 Heart failure, unspecified: Secondary | ICD-10-CM

## 2018-02-28 HISTORY — PX: RIGHT HEART CATH: CATH118263

## 2018-02-28 HISTORY — PX: CORONARY STENT INTERVENTION: CATH118234

## 2018-02-28 HISTORY — PX: LEFT HEART CATH AND CORS/GRAFTS ANGIOGRAPHY: CATH118250

## 2018-02-28 HISTORY — PX: IABP INSERTION: CATH118242

## 2018-02-28 HISTORY — PX: LEFT HEART CATH AND CORONARY ANGIOGRAPHY: CATH118249

## 2018-02-28 HISTORY — DX: Pneumonia, unspecified organism: J18.9

## 2018-02-28 LAB — POCT I-STAT, CHEM 8
BUN: 12 mg/dL (ref 6–20)
CALCIUM ION: 1.08 mmol/L — AB (ref 1.15–1.40)
CHLORIDE: 105 mmol/L (ref 101–111)
Creatinine, Ser: 0.7 mg/dL (ref 0.44–1.00)
Glucose, Bld: 164 mg/dL — ABNORMAL HIGH (ref 65–99)
HEMATOCRIT: 38 % (ref 36.0–46.0)
Hemoglobin: 12.9 g/dL (ref 12.0–15.0)
Potassium: 3.5 mmol/L (ref 3.5–5.1)
Sodium: 139 mmol/L (ref 135–145)
TCO2: 19 mmol/L — AB (ref 22–32)

## 2018-02-28 LAB — LIPID PANEL
Cholesterol: 301 mg/dL — ABNORMAL HIGH (ref 0–200)
HDL: 31 mg/dL — ABNORMAL LOW (ref >40)
LDL CALC: 221 mg/dL — AB (ref 0–99)
Total CHOL/HDL Ratio: 9.7 RATIO
Triglycerides: 247 mg/dL — ABNORMAL HIGH (ref <150)
VLDL: 49 mg/dL — ABNORMAL HIGH (ref 0–40)

## 2018-02-28 LAB — APTT: aPTT: 60 seconds — ABNORMAL HIGH (ref 24–36)

## 2018-02-28 LAB — BASIC METABOLIC PANEL
ANION GAP: 13 (ref 5–15)
BUN: 13 mg/dL (ref 6–20)
CO2: 19 mmol/L — AB (ref 22–32)
Calcium: 9 mg/dL (ref 8.9–10.3)
Chloride: 103 mmol/L (ref 101–111)
Creatinine, Ser: 0.89 mg/dL (ref 0.44–1.00)
GFR calc Af Amer: >60 mL/min (ref >60)
GLUCOSE: 139 mg/dL — AB (ref 65–99)
POTASSIUM: 4.4 mmol/L (ref 3.5–5.1)
Sodium: 135 mmol/L (ref 135–145)

## 2018-02-28 LAB — POCT I-STAT 3, ART BLOOD GAS (G3+)
Acid-base deficit: 7 mmol/L — ABNORMAL HIGH (ref 0.0–2.0)
Bicarbonate: 19 mmol/L — ABNORMAL LOW (ref 20.0–28.0)
O2 SAT: 98 %
PCO2 ART: 38.5 mmHg (ref 32.0–48.0)
PH ART: 7.301 — AB (ref 7.350–7.450)
PO2 ART: 117 mmHg — AB (ref 83.0–108.0)
TCO2: 20 mmol/L — AB (ref 22–32)

## 2018-02-28 LAB — CBC WITH DIFFERENTIAL/PLATELET
Basophils Absolute: 0.1 10*3/uL (ref 0.0–0.1)
Basophils Relative: 1 %
EOS ABS: 0.1 10*3/uL (ref 0.0–0.7)
Eosinophils Relative: 1 %
HEMATOCRIT: 40 % (ref 36.0–46.0)
Hemoglobin: 13.4 g/dL (ref 12.0–15.0)
LYMPHS ABS: 3.3 10*3/uL (ref 0.7–4.0)
Lymphocytes Relative: 33 %
MCH: 30.8 pg (ref 26.0–34.0)
MCHC: 33.5 g/dL (ref 30.0–36.0)
MCV: 92 fL (ref 78.0–100.0)
MONO ABS: 0.6 10*3/uL (ref 0.1–1.0)
MONOS PCT: 6 %
NEUTROS PCT: 59 %
Neutro Abs: 6.1 10*3/uL (ref 1.7–7.7)
Platelets: 306 10*3/uL (ref 150–400)
RBC: 4.35 MIL/uL (ref 3.87–5.11)
RDW: 14 % (ref 11.5–15.5)
WBC: 10.2 10*3/uL (ref 4.0–10.5)

## 2018-02-28 LAB — I-STAT BETA HCG BLOOD, ED (NOT ORDERABLE)

## 2018-02-28 LAB — COMPREHENSIVE METABOLIC PANEL
ALBUMIN: 3.3 g/dL — AB (ref 3.5–5.0)
ALK PHOS: 68 U/L (ref 38–126)
ALT: 30 U/L (ref 14–54)
AST: 33 U/L (ref 15–41)
Anion gap: 15 (ref 5–15)
BILIRUBIN TOTAL: 0.5 mg/dL (ref 0.3–1.2)
BUN: 12 mg/dL (ref 6–20)
CALCIUM: 8.8 mg/dL — AB (ref 8.9–10.3)
CO2: 18 mmol/L — ABNORMAL LOW (ref 22–32)
CREATININE: 0.96 mg/dL (ref 0.44–1.00)
Chloride: 104 mmol/L (ref 101–111)
GFR calc Af Amer: >60 mL/min (ref >60)
GLUCOSE: 183 mg/dL — AB (ref 65–99)
Potassium: 3.9 mmol/L (ref 3.5–5.1)
Sodium: 137 mmol/L (ref 135–145)
TOTAL PROTEIN: 6.1 g/dL — AB (ref 6.5–8.1)

## 2018-02-28 LAB — CBC
HEMATOCRIT: 41.3 % (ref 36.0–46.0)
HEMOGLOBIN: 13.8 g/dL (ref 12.0–15.0)
MCH: 30.3 pg (ref 26.0–34.0)
MCHC: 33.4 g/dL (ref 30.0–36.0)
MCV: 90.6 fL (ref 78.0–100.0)
Platelets: 311 10*3/uL (ref 150–400)
RBC: 4.56 MIL/uL (ref 3.87–5.11)
RDW: 13.7 % (ref 11.5–15.5)
WBC: 16.1 10*3/uL — ABNORMAL HIGH (ref 4.0–10.5)

## 2018-02-28 LAB — POCT I-STAT TROPONIN I: TROPONIN I, POC: 0.02 ng/mL (ref 0.00–0.08)

## 2018-02-28 LAB — MRSA PCR SCREENING: MRSA by PCR: NEGATIVE

## 2018-02-28 LAB — TROPONIN I
TROPONIN I: 0.04 ng/mL — AB (ref <0.03)
TROPONIN I: 19.37 ng/mL — AB (ref <0.03)
TROPONIN I: 47.17 ng/mL — AB (ref <0.03)

## 2018-02-28 LAB — POCT ACTIVATED CLOTTING TIME: ACTIVATED CLOTTING TIME: 356 s

## 2018-02-28 LAB — PROTIME-INR
INR: 0.97
Prothrombin Time: 12.8 seconds (ref 11.4–15.2)

## 2018-02-28 LAB — POCT I-STAT 3, VENOUS BLOOD GAS (G3P V)
Acid-base deficit: 6 mmol/L — ABNORMAL HIGH (ref 0.0–2.0)
Bicarbonate: 20.4 mmol/L (ref 20.0–28.0)
O2 SAT: 61 %
PCO2 VEN: 43.4 mmHg — AB (ref 44.0–60.0)
PO2 VEN: 36 mmHg (ref 32.0–45.0)
TCO2: 22 mmol/L (ref 22–32)
pH, Ven: 7.28 (ref 7.250–7.430)

## 2018-02-28 LAB — HEPARIN LEVEL (UNFRACTIONATED): Heparin Unfractionated: <0.1 IU/mL — ABNORMAL LOW (ref 0.30–0.70)

## 2018-02-28 SURGERY — LEFT HEART CATH AND CORONARY ANGIOGRAPHY
Anesthesia: LOCAL

## 2018-02-28 MED ORDER — ONDANSETRON HCL 4 MG/2ML IJ SOLN
INTRAMUSCULAR | Status: AC
  Filled 2018-02-28: qty 2

## 2018-02-28 MED ORDER — ADENOSINE 6 MG/2ML IV SOLN
INTRAVENOUS | Status: AC
  Filled 2018-02-28: qty 2

## 2018-02-28 MED ORDER — SIMVASTATIN 40 MG PO TABS: 40.0000 mg | ORAL_TABLET | Freq: Every day | ORAL | Status: DC

## 2018-02-28 MED ORDER — CHLORHEXIDINE GLUCONATE CLOTH 2 % EX PADS
6.0000 | MEDICATED_PAD | Freq: Every day | CUTANEOUS | Status: DC
  Administered 2018-02-28 – 2018-03-03 (×4): 6 via TOPICAL

## 2018-02-28 MED ORDER — ACETAMINOPHEN 325 MG PO TABS
650.0000 mg | ORAL_TABLET | ORAL | Status: DC | PRN
  Filled 2018-02-28: qty 2

## 2018-02-28 MED ORDER — ONDANSETRON HCL 4 MG/2ML IJ SOLN
4.0000 mg | Freq: Four times a day (QID) | INTRAMUSCULAR | Status: DC | PRN
Start: 2018-02-28 — End: 2018-02-28

## 2018-02-28 MED ORDER — LIDOCAINE HCL (PF) 1 % IJ SOLN
INTRAMUSCULAR | Status: DC | PRN
  Administered 2018-02-28: 15 mL
  Administered 2018-02-28: 10 mL

## 2018-02-28 MED ORDER — MORPHINE SULFATE (PF) 4 MG/ML IV SOLN
4.0000 mg | Freq: Once | INTRAVENOUS | Status: AC
  Administered 2018-02-28: 4 mg via INTRAVENOUS

## 2018-02-28 MED ORDER — ONDANSETRON HCL 4 MG/2ML IJ SOLN
INTRAMUSCULAR | Status: DC | PRN
  Administered 2018-02-28 (×2): 4 mg via INTRAVENOUS

## 2018-02-28 MED ORDER — NITROGLYCERIN 0.4 MG SL SUBL
0.4000 mg | SUBLINGUAL_TABLET | SUBLINGUAL | Status: DC | PRN
Start: 2018-02-28 — End: 2018-03-06
  Administered 2018-02-28: 0.4 mg via SUBLINGUAL
  Filled 2018-02-28: qty 1

## 2018-02-28 MED ORDER — ONDANSETRON HCL 4 MG/2ML IJ SOLN
4.0000 mg | Freq: Once | INTRAMUSCULAR | Status: AC
  Administered 2018-02-28: 4 mg via INTRAVENOUS

## 2018-02-28 MED ORDER — SODIUM CHLORIDE 0.9 % IV SOLN: 250.0000 mL | INTRAVENOUS | Status: DC | PRN

## 2018-02-28 MED ORDER — METOPROLOL TARTRATE 12.5 MG HALF TABLET
12.5000 mg | ORAL_TABLET | ORAL | Status: AC
  Administered 2018-02-28: 12.5 mg via ORAL
  Filled 2018-02-28: qty 1

## 2018-02-28 MED ORDER — ADENOSINE (DIAGNOSTIC) FOR INTRACORONARY USE
INTRAVENOUS | Status: DC | PRN
  Administered 2018-02-28: 60 ug via INTRACORONARY

## 2018-02-28 MED ORDER — FUROSEMIDE 10 MG/ML IJ SOLN
INTRAMUSCULAR | Status: AC
  Filled 2018-02-28: qty 4

## 2018-02-28 MED ORDER — ASPIRIN 81 MG PO CHEW
81.0000 mg | CHEWABLE_TABLET | Freq: Every day | ORAL | Status: DC
  Administered 2018-02-28 – 2018-03-06 (×7): 81 mg via ORAL
  Filled 2018-02-28 (×7): qty 1

## 2018-02-28 MED ORDER — TICAGRELOR 90 MG PO TABS
ORAL_TABLET | ORAL | Status: AC
  Filled 2018-02-28: qty 2

## 2018-02-28 MED ORDER — SODIUM CHLORIDE 0.9% FLUSH
10.0000 mL | INTRAVENOUS | Status: DC | PRN
  Administered 2018-03-01: 10 mL
  Filled 2018-02-28: qty 40

## 2018-02-28 MED ORDER — VERAPAMIL HCL 2.5 MG/ML IV SOLN
INTRAVENOUS | Status: DC | PRN
  Administered 2018-02-28: 400 ug via INTRACORONARY
  Administered 2018-02-28 (×3): 200 ug via INTRACORONARY

## 2018-02-28 MED ORDER — SODIUM CHLORIDE 0.9 % IV SOLN
INTRAVENOUS | Status: AC | PRN
  Administered 2018-02-28: 10 mL/h via INTRAVENOUS

## 2018-02-28 MED ORDER — TICAGRELOR 90 MG PO TABS
90.0000 mg | ORAL_TABLET | Freq: Two times a day (BID) | ORAL | Status: DC
  Administered 2018-02-28 – 2018-03-06 (×12): 90 mg via ORAL
  Filled 2018-02-28 (×12): qty 1

## 2018-02-28 MED ORDER — SODIUM CHLORIDE 0.9% FLUSH: 3.0000 mL | INTRAVENOUS | Status: DC | PRN

## 2018-02-28 MED ORDER — SODIUM CHLORIDE 0.9 % IV SOLN
INTRAVENOUS | Status: DC | PRN
  Administered 2018-02-28: 1.75 mg/kg/h via INTRAVENOUS

## 2018-02-28 MED ORDER — IOPAMIDOL (ISOVUE-370) INJECTION 76%
INTRAVENOUS | Status: AC
  Filled 2018-02-28: qty 100

## 2018-02-28 MED ORDER — MORPHINE SULFATE (PF) 4 MG/ML IV SOLN
INTRAVENOUS | Status: AC
  Filled 2018-02-28: qty 1

## 2018-02-28 MED ORDER — SODIUM CHLORIDE 0.9% FLUSH
10.0000 mL | Freq: Two times a day (BID) | INTRAVENOUS | Status: DC
  Administered 2018-02-28 – 2018-03-02 (×3): 10 mL

## 2018-02-28 MED ORDER — BIVALIRUDIN BOLUS VIA INFUSION - CUPID
INTRAVENOUS | Status: DC | PRN
  Administered 2018-02-28: 54.45 mg via INTRAVENOUS

## 2018-02-28 MED ORDER — LIDOCAINE HCL (PF) 1 % IJ SOLN
INTRAMUSCULAR | Status: AC
  Filled 2018-02-28: qty 30

## 2018-02-28 MED ORDER — HEPARIN SODIUM (PORCINE) 5000 UNIT/ML IJ SOLN
INTRAMUSCULAR | Status: AC
  Filled 2018-02-28: qty 1

## 2018-02-28 MED ORDER — ASPIRIN EC 81 MG PO TBEC: 81.0000 mg | DELAYED_RELEASE_TABLET | Freq: Every day | ORAL | Status: DC

## 2018-02-28 MED ORDER — HEPARIN SODIUM (PORCINE) 5000 UNIT/ML IJ SOLN
4000.0000 [IU] | Freq: Once | INTRAMUSCULAR | Status: AC
  Administered 2018-02-28: 5000 [IU] via INTRAVENOUS

## 2018-02-28 MED ORDER — SODIUM CHLORIDE 0.9 % IV SOLN
INTRAVENOUS | Status: AC | PRN
  Administered 2018-02-28: 100 mL/h via INTRAVENOUS

## 2018-02-28 MED ORDER — ACETAMINOPHEN 325 MG PO TABS
650.0000 mg | ORAL_TABLET | ORAL | Status: DC | PRN
  Administered 2018-02-28 – 2018-03-01 (×4): 650 mg via ORAL
  Filled 2018-02-28 (×5): qty 2

## 2018-02-28 MED ORDER — NITROGLYCERIN 1 MG/10 ML FOR IR/CATH LAB
INTRA_ARTERIAL | Status: AC
  Filled 2018-02-28: qty 10

## 2018-02-28 MED ORDER — NITROGLYCERIN 0.4 MG SL SUBL: 0.4000 mg | SUBLINGUAL_TABLET | SUBLINGUAL | Status: DC | PRN

## 2018-02-28 MED ORDER — FUROSEMIDE 10 MG/ML IJ SOLN
INTRAMUSCULAR | Status: DC | PRN
  Administered 2018-02-28: 40 mg via INTRAVENOUS

## 2018-02-28 MED ORDER — HEPARIN (PORCINE) IN NACL 2-0.9 UNIT/ML-% IJ SOLN
INTRAMUSCULAR | Status: AC
  Filled 2018-02-28: qty 1000

## 2018-02-28 MED ORDER — ATORVASTATIN CALCIUM 80 MG PO TABS
80.0000 mg | ORAL_TABLET | Freq: Every day | ORAL | Status: DC
  Administered 2018-02-28 – 2018-03-04 (×5): 80 mg via ORAL
  Filled 2018-02-28 (×5): qty 1

## 2018-02-28 MED ORDER — HEPARIN (PORCINE) IN NACL 100-0.45 UNIT/ML-% IJ SOLN
1600.0000 [IU]/h | INTRAMUSCULAR | Status: DC
  Administered 2018-02-28: 750 [IU]/h via INTRAVENOUS
  Administered 2018-03-01: 1400 [IU]/h via INTRAVENOUS
  Administered 2018-03-02 – 2018-03-03 (×2): 1500 [IU]/h via INTRAVENOUS
  Filled 2018-02-28 (×4): qty 250

## 2018-02-28 MED ORDER — TICAGRELOR 90 MG PO TABS
ORAL_TABLET | ORAL | Status: DC | PRN
  Administered 2018-02-28 (×2): 180 mg via ORAL

## 2018-02-28 MED ORDER — HEPARIN (PORCINE) IN NACL 2-0.9 UNIT/ML-% IJ SOLN
INTRAMUSCULAR | Status: AC | PRN
  Administered 2018-02-28: 500 mL

## 2018-02-28 MED ORDER — METOPROLOL TARTRATE 25 MG PO TABS
25.0000 mg | ORAL_TABLET | Freq: Two times a day (BID) | ORAL | Status: DC
  Administered 2018-02-28 – 2018-03-06 (×9): 25 mg via ORAL
  Filled 2018-02-28 (×10): qty 1

## 2018-02-28 MED ORDER — BIVALIRUDIN TRIFLUOROACETATE 250 MG IV SOLR
INTRAVENOUS | Status: AC
  Filled 2018-02-28: qty 250

## 2018-02-28 MED ORDER — SODIUM CHLORIDE 0.9% FLUSH
3.0000 mL | Freq: Two times a day (BID) | INTRAVENOUS | Status: DC
  Administered 2018-03-01 – 2018-03-02 (×3): 3 mL via INTRAVENOUS
  Administered 2018-03-03: 10 mL via INTRAVENOUS
  Administered 2018-03-04 – 2018-03-06 (×4): 3 mL via INTRAVENOUS

## 2018-02-28 MED ORDER — IOPAMIDOL (ISOVUE-370) INJECTION 76%
INTRAVENOUS | Status: AC
  Filled 2018-02-28: qty 125

## 2018-02-28 MED ORDER — ONDANSETRON HCL 4 MG/2ML IJ SOLN
4.0000 mg | Freq: Four times a day (QID) | INTRAMUSCULAR | Status: DC | PRN
  Administered 2018-02-28 – 2018-03-01 (×2): 4 mg via INTRAVENOUS
  Filled 2018-02-28 (×3): qty 2

## 2018-02-28 MED ORDER — VERAPAMIL HCL 2.5 MG/ML IV SOLN
INTRAVENOUS | Status: AC
  Filled 2018-02-28: qty 2

## 2018-02-28 MED ORDER — LABETALOL HCL 5 MG/ML IV SOLN: 10.0000 mg | INTRAVENOUS | Status: AC | PRN

## 2018-02-28 MED ORDER — SODIUM CHLORIDE 0.9 % IV SOLN
INTRAVENOUS | Status: DC
  Administered 2018-02-28: 03:00:00 via INTRAVENOUS

## 2018-02-28 MED ORDER — HYDRALAZINE HCL 20 MG/ML IJ SOLN: 5.0000 mg | INTRAMUSCULAR | Status: AC | PRN

## 2018-02-28 MED ORDER — SENNOSIDES-DOCUSATE SODIUM 8.6-50 MG PO TABS
1.0000 | ORAL_TABLET | Freq: Two times a day (BID) | ORAL | Status: DC | PRN
Start: 2018-02-28 — End: 2018-03-06

## 2018-02-28 MED ORDER — ORAL CARE MOUTH RINSE
15.0000 mL | Freq: Two times a day (BID) | OROMUCOSAL | Status: DC
  Administered 2018-02-28 – 2018-03-05 (×4): 15 mL via OROMUCOSAL

## 2018-02-28 MED ORDER — METOPROLOL TARTRATE 12.5 MG HALF TABLET
12.5000 mg | ORAL_TABLET | Freq: Two times a day (BID) | ORAL | Status: DC
  Administered 2018-02-28: 12.5 mg via ORAL
  Filled 2018-02-28: qty 1

## 2018-02-28 SURGICAL SUPPLY — 23 items
BALLN IABP SENSA PLUS 7.5F 40C (BALLOONS) ×2
BALLN SAPPHIRE 2.5X20 (BALLOONS) ×2
BALLOON IABP SENS PLUS 7.5F40C (BALLOONS) IMPLANT
BALLOON SAPPHIRE 2.5X20 (BALLOONS) IMPLANT
CATH INFINITI 5FR MULTPACK ANG (CATHETERS) ×1 IMPLANT
CATH INFINITI JR4 5F (CATHETERS) ×1 IMPLANT
CATH PRIORITY ONE AC 6F (CATHETERS) ×1 IMPLANT
CATH SWAN GANZ 7F STRAIGHT (CATHETERS) ×1 IMPLANT
CATH VISTA GUIDE 6FR AL1 (CATHETERS) ×1 IMPLANT
COVER PRB 48X5XTLSCP FOLD TPE (BAG) IMPLANT
COVER PROBE 5X48 (BAG) ×2
KIT ENCORE 26 ADVANTAGE (KITS) ×1 IMPLANT
KIT HEART LEFT (KITS) ×2 IMPLANT
KIT HEMO VALVE WATCHDOG (MISCELLANEOUS) ×1 IMPLANT
PACK CARDIAC CATHETERIZATION (CUSTOM PROCEDURE TRAY) ×2 IMPLANT
SHEATH AVANTI 11CM 6FR (SHEATH) ×1 IMPLANT
SHEATH AVANTI 11CM 7FR (SHEATH) ×1 IMPLANT
SLEEVE REPOSITIONING LENGTH 30 (MISCELLANEOUS) ×1 IMPLANT
STENT SYNERGY DES 3.5X38 (Permanent Stent) ×1 IMPLANT
TRANSDUCER W/STOPCOCK (MISCELLANEOUS) ×2 IMPLANT
TUBING CIL FLEX 10 FLL-RA (TUBING) ×2 IMPLANT
WIRE ASAHI PROWATER 180CM (WIRE) ×1 IMPLANT
WIRE EMERALD 3MM-J .035X150CM (WIRE) ×1 IMPLANT

## 2018-02-28 NOTE — Progress Notes (Addendum)
Progress Note  Patient Name: Kelsey Padilla Date of Encounter: 02/28/2018  Primary Cardiologist: No primary care provider on file.   Subjective   The patient was lying in her bed today upon entering the room. She was very tired and lethargic only having arrived on the floor less than 2 hours prior to the current evaluation. She denied acute complaints attesting to improving chest pain.   Inpatient Medications    Scheduled Meds: . aspirin  81 mg Oral Daily  . atorvastatin  80 mg Oral q1800  . Chlorhexidine Gluconate Cloth  6 each Topical Daily  . mouth rinse  15 mL Mouth Rinse BID  . metoprolol tartrate  12.5 mg Oral NOW  . metoprolol tartrate  25 mg Oral BID  . sodium chloride flush  10-40 mL Intracatheter Q12H  . sodium chloride flush  3 mL Intravenous Q12H  . ticagrelor  90 mg Oral BID   Continuous Infusions: . sodium chloride 10 mL/hr at 02/28/18 0700  . sodium chloride 250 mL (02/28/18 0700)  . heparin 750 Units/hr (02/28/18 0749)   PRN Meds: sodium chloride, acetaminophen, hydrALAZINE, labetalol, nitroGLYCERIN, ondansetron (ZOFRAN) IV, sodium chloride flush, sodium chloride flush   Vital Signs    Vitals:   02/28/18 0900 02/28/18 0955 02/28/18 1000 02/28/18 1100  BP: (!) 122/94  (!) 128/100 (!) 117/93  Pulse: 75 82 78 62  Resp: (!) 25 19 (!) 26 (!) 25  Temp: 98.4 F (36.9 C) 98.4 F (36.9 C) 98.6 F (37 C) 99 F (37.2 C)  TempSrc:      SpO2: 98% 100% 97% 97%  Weight:      Height:        Intake/Output Summary (Last 24 hours) at 02/28/2018 1132 Last data filed at 02/28/2018 1000 Gross per 24 hour  Intake 246.6 ml  Output 500 ml  Net -253.4 ml   Filed Weights   02/28/18 0246 02/28/18 0545  Weight: 72.6 kg (160 lb) 72.6 kg (160 lb 0.9 oz)    Telemetry    NSR with STE in lead II- Personally Reviewed  ECG    NSR with diffuse STE in leads II, III, aVF as well as ST depression in leads V1-V3 consistent with an inferior MI w/ possible septal  involvement- Personally Reviewed  Physical Exam   GEN:  The patient is lying in bed with currently stable vitals.  She does not appear to be in acute distress nondiaphoretic. Neck: No JVD Cardiac: RRR, no murmur auscultated but balloon pump audible, no rubs or gallops.  Respiratory: Clear to auscultation bilaterally. GI: Soft, nontender, non-distended  MS: No edema; No deformity. Neuro:  Nonfocal.  alert and oriented to self, date, and reason for visit  Psych: Normal affect   Labs    Chemistry Recent Labs  Lab 02/28/18 0247 02/28/18 1028  NA 137 135  K 3.9 4.4  CL 104 103  CO2 18* 19*  GLUCOSE 183* 139*  BUN 12 13  CREATININE 0.96 0.89  CALCIUM 8.8* 9.0  PROT 6.1*  --   ALBUMIN 3.3*  --   AST 33  --   ALT 30  --   ALKPHOS 68  --   BILITOT 0.5  --   GFRNONAA >60 >60  GFRAA >60 >60  ANIONGAP 15 13     Hematology Recent Labs  Lab 02/28/18 0247  WBC 10.2  RBC 4.35  HGB 13.4  HCT 40.0  MCV 92.0  MCH 30.8  MCHC 33.5  RDW  14.0  PLT 306    Cardiac Enzymes Recent Labs  Lab 02/28/18 0247  TROPONINI 0.04*    Recent Labs  Lab 02/28/18 0254  TROPIPOC 0.02     BNPNo results for input(s): BNP, PROBNP in the last 168 hours.   DDimer No results for input(s): DDIMER in the last 168 hours.   Radiology    Dg Chest Portable 1 View  Result Date: 02/28/2018 CLINICAL DATA:  Non ST-elevation myocardial infarction, acute onset. EXAM: PORTABLE CHEST 1 VIEW COMPARISON:  Chest radiograph performed 09/11/2011 FINDINGS: The lungs are well-aerated. Pulmonary vascularity is at the upper limits of normal. Mild retrocardiac opacity may reflect atelectasis. There is no evidence of pleural effusion or pneumothorax. The cardiomediastinal silhouette is normal in size. The patient is status post median sternotomy. No acute osseous abnormalities are seen. IMPRESSION: Mild retrocardiac opacity may reflect atelectasis. Lungs otherwise clear. Electronically Signed   By: Roanna Raider  M.D.   On: 02/28/2018 06:43    Cardiac Studies  Cardiac cath on 02-28-2018:  Prox RCA to Mid RCA lesion is 75% stenosed.  Dist RCA lesion is 100% stenosed. SVG to PDA is occluded.  Mid LM to Ost LAD lesion is 75% stenosed.  Prox LAD to Mid LAD lesion is 100% stenosed. LIMA to LAD.  Origin to Prox Graft lesion is 40% stenosed.  Ost 2nd Mrg lesion is 100% stenosed. SVG to OM Dist Graft to Insertion lesion is 100% stenosed. This is the culprit.  A drug-eluting stent was successfully placed using a STENT SYNERGY DES 3.5X38.  Post intervention, there is a 0% residual stenosis. However, microvascular occlusion will cause ongoing injury.  The left ventricular ejection fraction is 25-35% by visual estimate.  There is moderate to severe left ventricular systolic dysfunction.  LV end diastolic pressure is moderately elevated.  There is no aortic valve stenosis.  Ao sat 98%, PA sat 61%, PA mean 18 mm Hg; PCWP 17 mm Hg  IABP placed   Decreased EF increased LVEDP.  BP stable.  IABP placed given ongoing ischemia due to no reflow.   DAPT for 1 year. She will need aggressive secondary prevention and CHF management.   Patient Profile     52 y.o. female with known past medical history consistent of CAD as/P CABG (CABG in 2012, LIMA to LAD, SVG to OM and SVG to RPDA), who presented with acute chest pain found to have inferior/posterior ST E.  Patient said that she awoke with acute chest pain that was accompanied by shortness of breath, diaphoresis with approximately 1.5 hours from onset to placing the call.  At that point he took a full dose ASA and called EMS which upon arrival noted that her initial BP was 77/46.  She responded well to IV fluid bolus and demonstrated 100% stenosis of the distal RCA, occluded SVG to PDA, proximal to mid LAD lesion 90% stenosed with intact LIMA to LAD being her primary source perfusion to the cardiac musculature.  Echo ordered.   Assessment & Plan    CARDIOVASCULAR STEMI: Patient with typical chest pain, elevated troponin and EKG changes consistent with inferior wall MI in the setting of a CABG in 2012. She under went cardiac cath w/ PCI as above w/ aggressive secondary prevention recommended given the extent of her injury.  -Continue ASA 81 mg daily -Continue atorvastatin 80 mg daily -Continue heparin continue metoprolol per pharmacy- 5 mg twice daily -Continue nitroglycerin sublingual every 5 minutes.  Chest pain -Continue the Brilinta 90  mg daily -Continue to maintain tight blood pressure control monitor for hypotension given severity of illness.  -Continue IABP for blood pressure support -Continue to trend troponins until decrease Swan-Ganz catheter pressure 22/9 during evaluation indicating probable continued benefit from IABP  PULMONARY A: Continue to monitor the patient's rotatory status No acute issues-continue P:   Oxygen via nasal cannula to titrate O2 above 94%  RENAL A:   No acute issues currently. Cr 0.96 which is above her baseline of ~0.61 P:   Daily BMP to monitor renal function and electrolytes  GASTROINTESTINAL A:   NO acute issues P:   Carb modified diet as tolerated.  Senokot PRN for constipation   HEMATOLOGIC A:   Hgb stable at 13.4 on admission. WBC WNL's APTT 60, pT 12.8 with INR 0.97 P:  Daily CBC for Hgb Heparin as per pharmacy  Brilliant and ASA continued    INFECTIOUS A:   NO acute signs of systemic or local infection.  Catheter site was clear and clean on evaluation today.  P:   Continue catheter site care. CBC daily for WBC  ENDOCRINE A:   No acute issues P:   Will monitor.  For questions or updates, please contact [facility] Please consult [email or web address] for contact info under Cardiology/STEMI.   Signed, Lanelle BalLawrence Harbrecht, MD  02/28/2018, 11:32 AM   [phone]  Agree with that by Dr. Crista ElliotHarbrecht  Kelsey Padilla presented with an inferolateral STEMI. She had bypass  grafting 7 years ago. Her circumflex obtuse marginal branch graft was occluded. Her LIMA was  Patent. Her right coronary artery graft was occluded. She had severe LV dysfunction with an EF of 25-35% range with elevated LVEDP. Dr. Eldridge DaceVaranasi attempted to revascularize her circumflex obtuse marginal graft unsuccessfully because of "no reflow. Not a candidate for redo bypass grafting.we will continue her intra-aortic balloon pump one-to-one today and wean her off tomorrow. She currently denies chest pain or shortness of breath. Her exam otherwise is benign. Her groin is stable and she has intact pedal pulse. Her EKG is consistent with inferolateral STEMI.  Runell GessJonathan J. Shann Lewellyn, M.D., FACP, [facility] CenterFACC, Earl LagosFAHA, [facility] CenterFSCAI [facility] [address] [place], [place]  [id number]  [phone] 02/28/2018 3:08 PM

## 2018-02-28 NOTE — ED Provider Notes (Signed)
[facility] Note   CSN: [id number] Arrival date & time: 02/28/18  0241     History   Chief Complaint Chief Complaint  Patient presents with  . Code STEMI    HPI Kelsey Padilla is a 52 y.o. female.  Level 5 caveat for acuity of condition.  [facility] with burning sensation in her chest, throat and upper back that radiates into her left arm.  Initial EMS EKG showed ST depressions in the septal leads.  Subsequent EKG showed ST depressions septally and ST elevations inferiorly.  Woke her from sleep about 1:15 AM.  She was diaphoretic and nauseated.  EMS gave her 1 sublingual nitroglycerin which decreased her blood pressure.  EKG shows posterior MI with inferior elevation and septal ST depressions.  ASA and heparin given.   Patient reports having a CABG in 2012.  She has not seen cardiology since.  She is never had this kind of chest pain before.   The history is provided by the patient and the EMS personnel. The history is limited by the condition of the patient.    Past Medical History:  Diagnosis Date  . Anemia, macrocytic   . Coronary artery disease   . Hyperlipidemia   . 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 LIMA to LAD, SVG to OM, SVG to RPDA  . Tobacco abuse   . Vitamin B12 deficiency     Patient Active Problem List   Diagnosis Date Noted  . Ankle pain 07/21/2012  . Anemia, macrocytic   . Coronary artery disease   . Tobacco abuse   . Hyperlipidemia   . 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 CARDIAC CATHETERIZATION  07/2011   significnat distal left main and 3 vessel CAD. Normal EF.   Marland Kitchen. CORONARY ARTERY BYPASS GRAFT      OB History    No data available       Home Medications    Prior to Admission medications   Medication Sig Start Date End Date Taking? Authorizing Provider  aspirin EC 81 MG tablet Take 81 mg by mouth daily.    [provider]  ibuprofen (ADVIL,MOTRIN) 200 MG tablet Take 200 mg by mouth every 6 (six) hours as needed.      [provider]  metoprolol tartrate (LOPRESSOR) 25 MG tablet TAKE 1/2 TABLET BY MOUTH TWICE DAILY 07/22/13   Rollene RotundaHochrein, James, MD  nitroGLYCERIN (NITROSTAT) 0.4 MG SL tablet Place 0.4 mg under the tongue every 5 (five) minutes as needed.      [provider]  pseudoephedrine (SUDAFED) 30 MG tablet Take 30 mg by mouth as needed for congestion.    [provider]  simvastatin (ZOCOR) 40 MG tablet TAKE 1 TABLET BY MOUTH AT BEDTIME 08/24/14   Rollene RotundaHochrein, James, MD    Family History No family history on file.  Social History Social History   Tobacco Use  . Smoking status: Current Some Day Smoker    Packs/day: 0.80    Years: 20.00    Pack years: 16.00    Types: Cigarettes    Last attempt to quit: 08/16/2011    Years since quitting: 6.5  . Smokeless tobacco: Never Used  . Tobacco comment: one pack last 4 days  Substance Use Topics  . Alcohol use: No  . Drug use: No     Allergies   Codeine  Review of Systems Review of Systems  Constitutional: Positive for diaphoresis. Negative for fever.  HENT: Negative for congestion and rhinorrhea.   Eyes: Negative for visual disturbance.  Respiratory: Positive for chest tightness and shortness of breath.   Cardiovascular: Positive for chest pain.  Gastrointestinal: Positive for nausea. Negative for abdominal pain and vomiting.  Genitourinary: Negative for dysuria, hematuria, vaginal bleeding and vaginal discharge.  Musculoskeletal: Positive for back pain.  Skin: Negative for wound.  Neurological: Positive for weakness. Negative for dizziness, light-headedness and numbness.    all other systems are negative except as noted in the HPI and PMH.    Physical Exam Updated Vital Signs BP 113/75   Pulse 78   Resp 15   Wt 72.6 kg (160 lb)   SpO2 100%   BMI 28.34 kg/m   Physical Exam  Constitutional: She is  oriented to person, place, and time. She appears well-developed and well-nourished. She appears distressed.  Pale appearing and diaphoretic  HENT:  Head: Normocephalic and atraumatic.  Mouth/Throat: Oropharynx is clear and moist. No oropharyngeal exudate.  Eyes: Conjunctivae and EOM are normal. Pupils are equal, round, and reactive to light.  Neck: Normal range of motion. Neck supple.  No meningismus.  Cardiovascular: Normal rate, regular rhythm, normal heart sounds and intact distal pulses.  No murmur heard. Pulmonary/Chest: Effort normal and breath sounds normal. No respiratory distress. She exhibits no tenderness.  Abdominal: Soft. There is no tenderness. There is no rebound and no guarding.  Musculoskeletal: Normal range of motion. She exhibits no edema or tenderness.  Neurological: She is alert and oriented to person, place, and time. No cranial nerve deficit. She exhibits normal muscle tone. Coordination normal.   5/5 strength throughout. CN 2-12 intact.Equal grip strength.   Skin: Skin is warm. Capillary refill takes less than 2 seconds.  Psychiatric: She has a normal mood and affect. Her behavior is normal.  Nursing note and vitals reviewed.    ED Treatments / Results  Labs (all labs ordered are listed, but only abnormal results are displayed) Labs Reviewed  APTT - Abnormal; Notable for the following components:      Result Value   aPTT 60 (*)    All other components within normal limits  COMPREHENSIVE METABOLIC PANEL - Abnormal; Notable for the following components:   CO2 18 (*)    Glucose, Bld 183 (*)    Calcium 8.8 (*)    Total Protein 6.1 (*)    Albumin 3.3 (*)    All other components within normal limits  TROPONIN I - Abnormal; Notable for the following components:   Troponin I 0.04 (*)    All other components within normal limits  LIPID PANEL - Abnormal; Notable for the following components:   Cholesterol 301 (*)    Triglycerides 247 (*)    HDL 31 (*)    VLDL  49 (*)    LDL Cholesterol 221 (*)    All other components within normal limits  CBC WITH DIFFERENTIAL/PLATELET  PROTIME-INR  HIV ANTIBODY (ROUTINE TESTING)  I-STAT BETA HCG BLOOD, ED ([facility], [facility], [facility] ONLY)  I-STAT TROPONIN, ED  I-STAT BETA HCG BLOOD, ED (NOT ORDERABLE)  POCT I-STAT TROPONIN I    EKG  EKG Interpretation  Date/Time:  Friday February 28 2018 02:46:45 EDT Ventricular Rate:  73 PR Interval:    QRS Duration: 95 QT Interval:  442 QTC Calculation: 488 R Axis:   105 Text Interpretation:  Sinus rhythm Inferior infarct, acute (RCA) Lateral leads are  also involved Probable RV involvement, suggest recording right precordial leads POSTERIOR AND INFERIOR STEMI Confirmed by Glynn Octave [phone]) on 02/28/2018 3:02:15 AM       Radiology No results found.  Procedures Procedures (including critical care time)  Medications Ordered in ED Medications  0.9 %  sodium chloride infusion ( Intravenous New Bag/Given 02/28/18 0251)  heparin injection 4,000 Units (5,000 Units Intravenous Given 02/28/18 0250)  morphine 4 MG/ML injection 4 mg (4 mg Intravenous Given 02/28/18 0250)  ondansetron (ZOFRAN) injection 4 mg (4 mg Intravenous Given 02/28/18 0250)     Initial Impression / Assessment and Plan / ED Course  I have reviewed the triage vital signs and the nursing notes.  Pertinent labs & imaging results that were available during my care of the patient were reviewed by me and considered in my medical decision making (see chart for details).     Patient arrives with chest pain that woke her from sleep associated with upper back pain shortness of breath and nausea.  EKG remarkable for posterior and inferior STEMI.  Patient given aspirin, heparin.  No further nitroglycerin given her relative hypotension and posterior MI.  Cardiology fellow at bedside on arrival.  Patient maintaining airway and blood pressure.  Discussed with Dr. Eldridge Dace of cardiology who will take patient to Cath Lab  emergently.  CRITICAL CARE Performed by: Glynn Octave Total critical care time: 31 minutes Critical care time was exclusive of separately billable procedures and treating other patients. Critical care was necessary to treat or prevent imminent or life-threatening deterioration. Critical care was time spent personally by me on the following activities: development of treatment plan with patient and/or surrogate as well as nursing, discussions with consultants, evaluation of patient's response to treatment, examination of patient, obtaining history from patient or surrogate, ordering and performing treatments and interventions, ordering and review of laboratory studies, ordering and review of radiographic studies, pulse oximetry and re-evaluation of patient's condition.   Final Clinical Impressions(s) / ED Diagnoses   Final diagnoses:  ST elevation myocardial infarction (STEMI), unspecified artery [facility])    ED Discharge Orders    None       Glynn Octave, MD 02/28/18 [phone]

## 2018-02-28 NOTE — ED Triage Notes (Signed)
Patient with cardiac history, awoke at 0115 with burning sensation in her chest.  She states that the pain goes all the way across her chest.  No shortness of breath but she is diaphoretic.  She was given 1 SL nitro which has decreased her pressure.   Patient took 325mg  ASA before EMS arrival.

## 2018-02-28 NOTE — Progress Notes (Signed)
Pt Trop 47.17 up from 19.37. No co of chest pain. Dr Allena EaringBazemore notified no orders received.

## 2018-02-28 NOTE — Progress Notes (Signed)
Patient resting in her bed upon entering the room. She stated that she was feeling slightly improved but was still concerned with her back pain and nausea.  She stated that the back pain is chronic and preceded the chest pain. It is currently a 4/10 at maximum and 2/10 at times. As such, she is in agreement with holding off on treatment unless it worsens. I would consider low dose fentanyl at maybe 25mcg IV if this pain worsens as morphine worsened her nausea and NSAIDS are contraindicated.   Nausea: Has received a dose of IV zofran 4mg  which has not resolved her symptoms initially. If she continues to have nausea we may need to adjust her medications.

## 2018-02-28 NOTE — Progress Notes (Signed)
Pt unable to void for 8 hours. Prior had voided 500cc in purewick. Bladder scanned for 980cc and I/O cathed per protocol for 825 cc clear yellow urine at 1615. Continue to monitor. Kelsey SoursAngela Jaquise Padilla

## 2018-02-28 NOTE — Progress Notes (Signed)
ANTICOAGULATION CONSULT NOTE  Pharmacy Consult:  Heparin Indication: IABP  Allergies  Allergen Reactions  . Codeine Nausea And Vomiting  . Statins     Patient says cannot tolerate statins d/t muscle cramping    Patient Measurements: Height: 5\' 3"  (160 cm) Weight: 160 lb 0.9 oz (72.6 kg) IBW/kg (Calculated) : 52.4 Heparin Dosing Weight: 68 kg  Vital Signs: Temp: 99.3 F (37.4 C) (03/15 1500) Temp Source: Core (03/15 0545) BP: 119/92 (03/15 1300) Pulse Rate: 73 (03/15 1500)  Labs: Recent Labs    02/28/18 0247 02/28/18 0337 02/28/18 1028 02/28/18 1126 02/28/18 1400  HGB 13.4 12.9 13.8  --   --   HCT 40.0 38.0 41.3  --   --   PLT 306  --  311  --   --   APTT 60*  --   --   --   --   LABPROT 12.8  --   --   --   --   INR 0.97  --   --   --   --   HEPARINUNFRC  --   --   --   --  <0.10*  CREATININE 0.96 0.70 0.89  --   --   TROPONINI 0.04*  --   --  19.37*  --     Estimated Creatinine Clearance: 72.2 mL/min (by C-G formula based on SCr of 0.89 mg/dL).   Assessment: [age] YOF admitted with STEMI, found with significant CAD, IABP placed in cath lab.  Pharmacy asked to manage IV heparin for IABP.    Heparin level is undetectable.  No issue with heparin infusion and no bleeding from site per RN.   Goal of Therapy:  Heparin level 0.2-0.5 units/mL Monitor platelets by anticoagulation protocol: Yes    Plan:  Increase heparin gtt to 950 units/hr Check 6 hr heparin level   Sundus Pete D. Laney Potashang, PharmD, BCPS Pager:  [phone] 02/28/2018, 3:58 PM

## 2018-02-28 NOTE — ED Notes (Signed)
To Cath lab with RN, MD and NT.

## 2018-02-28 NOTE — H&P (Addendum)
Cardiology History & Physical    Patient ID: Kelsey Padilla MRN: [id number], DOB: 1966-06-18 Date of Encounter: 02/28/2018, 3:03 AM Primary Physician: No primary care provider on file.  Chief Complaint: Chest pain   HPI: Kelsey SchneidersBetty J Padilla is a 52 y.o. female with history of CAD s/p CABg (2012, LIMA to LAD, SVG to OM, SVG to RPDA), who presents with acute chest pain, found to have inferior/posterior STE.  Patient woke up with acute chest pain, associated with SOB, and significant diaphoresis, approximately 1.5 hours prior to presentation.  She took a full dose ASA and called EMS.  Upon EMS arrival her initial BP was 77/46.  She was given IVF bolus with improvement in BPs.  Initial 12 lead showed STE in III, aVF and profound STD in V1-V3.  A code STEMI was called, and the patient was brought to the [facility] ED for further evaluation.   Past Medical History:  Diagnosis Date  . Anemia, macrocytic   . Coronary artery disease   . Hyperlipidemia   . 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 LIMA to LAD, SVG to OM, SVG to RPDA  . Tobacco abuse   . Vitamin B12 deficiency      Surgical History:  Past Surgical History:  Procedure Laterality Date  . CARDIAC CATHETERIZATION  07/2011   significnat distal left main and 3 vessel CAD. Normal EF.   Marland Kitchen. CORONARY ARTERY BYPASS GRAFT       Home Meds: Prior to Admission medications   Medication Sig Start Date End Date Taking? Authorizing Provider  aspirin EC 81 MG tablet Take 81 mg by mouth daily.    [provider]  ibuprofen (ADVIL,MOTRIN) 200 MG tablet Take 200 mg by mouth every 6 (six) hours as needed.      [provider]  metoprolol tartrate (LOPRESSOR) 25 MG tablet TAKE 1/2 TABLET BY MOUTH TWICE DAILY 07/22/13   Rollene RotundaHochrein, James, MD  nitroGLYCERIN (NITROSTAT) 0.4 MG SL tablet Place 0.4 mg under the tongue every 5 (five) minutes as needed.      [provider]  pseudoephedrine (SUDAFED) 30 MG tablet Take 30 mg by mouth as needed for  congestion.    [provider]  simvastatin (ZOCOR) 40 MG tablet TAKE 1 TABLET BY MOUTH AT BEDTIME 08/24/14   Rollene RotundaHochrein, James, MD    Allergies:  Allergies  Allergen Reactions  . Codeine Nausea And Vomiting    Social History   Socioeconomic History  . Marital status: Married    Spouse name: Not on file  . Number of children: Not on file  . Years of education: Not on file  . Highest education level: Not on file  Social Needs  . Financial resource strain: Not on file  . Food insecurity - worry: Not on file  . Food insecurity - inability: Not on file  . Transportation needs - medical: Not on file  . Transportation needs - non-medical: Not on file  Occupational History  . Not on file  Tobacco Use  . Smoking status: Current Some Day Smoker    Packs/day: 0.80    Years: 20.00    Pack years: 16.00    Types: Cigarettes    Last attempt to quit: 08/16/2011    Years since quitting: 6.5  . Smokeless tobacco: Never Used  . Tobacco comment: one pack last 4 days  Substance and Sexual Activity  . Alcohol use: No  . Drug use: No  . Sexual activity: Not  on file  Other Topics Concern  . Not on file  Social History Narrative   Has 91 year old daughter and 30 year old son   Has 1 grandchild 31 mths old     No family history on file.  Review of Systems: All other systems reviewed and are otherwise negative except as noted above.  Labs:   Lab Results  Component Value Date   WBC 8.0 08/19/2011   HGB 9.5 (L) 08/19/2011   HCT 28.3 (L) 08/19/2011   MCV 100.4 (H) 08/19/2011   PLT 407 (H) 08/19/2011   No results for input(s): NA, K, CL, CO2, BUN, CREATININE, CALCIUM, PROT, BILITOT, ALKPHOS, ALT, AST, GLUCOSE in the last 168 hours.  Invalid input(s): LABALBU No results for input(s): CKTOTAL, CKMB, TROPONINI in the last 72 hours. Lab Results  Component Value Date   CHOL 130 08/16/2011   HDL 25 (L) 08/16/2011   LDLCALC 87 08/16/2011   TRIG 91 08/16/2011   No results found  for: DDIMER  Radiology/Studies:  No results found. Wt Readings from Last 3 Encounters:  02/28/18 72.6 kg (160 lb)  02/13/13 78.4 kg (172 lb 12.8 oz)  07/21/12 77.1 kg (170 lb)    EKG: NSR, STE in III, aVF, STD in V1-V3.  Physical Exam: Blood pressure 113/75, pulse 78, resp. rate 15, weight 72.6 kg (160 lb), SpO2 100 %. Body mass index is 28.34 kg/m. General: Well developed, well nourished, in no acute distress. Head: Normocephalic, atraumatic, sclera non-icteric, no xanthomas, nares are without discharge.  Neck: Negative for carotid bruits. JVD not elevated. Lungs: Clear bilaterally to auscultation without wheezes, rales, or rhonchi. Breathing is unlabored. Heart: RRR with S1 S2. No murmurs, rubs, or gallops appreciated. Abdomen: Soft, non-tender, non-distended with normoactive bowel sounds. No hepatomegaly. No rebound/guarding. No obvious abdominal masses. Msk:  Strength and tone appear normal for age. Extremities: No clubbing or cyanosis. No edema.  Distal pedal pulses are 2+ and equal bilaterally. Neuro: Alert and oriented X 3. No focal deficit. No facial asymmetry. Moves all extremities spontaneously. Psych:  Responds to questions appropriately with a normal affect.    Assessment and Plan  69F with CAD s/p prior CABG, who presents with acute chest pain, and was found to have an inferior/posterior STEMI.  We will plan to take the patient for emergent coronary/graft angiography and possible PCI.    Signed, Esmond Plants, MD 02/28/2018, 3:03 AM    I have examined the patient and reviewed assessment and plan and discussed with patient.  Agree with above as stated.  Patient with acute posterior MI.  I personally reviewed the ECG and made the decision to bring the patient to the cath lab.    She had an occluded vein graft to the OM.  THere was severe thrombus and debris at the graft insertion.  Stent was successfully placed but she had no reflow which will cause further  myocardial injury.  Chest pain resolved at the end fo the procedure.  IABP placed due to elevated LVEDP and decreased EF, but BP was stable.  Right heart pressures were stable as well.  To ICU.  Aggressive secondary prevention.  SHe needs to stop smoking.  Lance Muss

## 2018-02-28 NOTE — Progress Notes (Signed)
CRITICAL VALUE ALERT  Critical Value: trop  Date & Time Notied:  02/28/2018   Provider Notified: Harbrecht  Orders Received/Actions taken: none

## 2018-02-28 NOTE — Progress Notes (Signed)
ANTICOAGULATION CONSULT NOTE - Initial Consult  Pharmacy Consult for IV heparin Indication: IABP  Allergies  Allergen Reactions  . Codeine Nausea And Vomiting    Patient Measurements: Height: 5\' 3"  (160 cm) Weight: 160 lb 0.9 oz (72.6 kg) IBW/kg (Calculated) : 52.4 Heparin Dosing Weight: 67.6 kg  Vital Signs: Temp: 98.2 F (36.8 C) (03/15 0700) Temp Source: Core (03/15 0545) BP: 133/107 (03/15 0700) Pulse Rate: 82 (03/15 0700)  Labs: Recent Labs    02/28/18 0247  HGB 13.4  HCT 40.0  PLT 306  APTT 60*  LABPROT 12.8  INR 0.97  CREATININE 0.96  TROPONINI 0.04*    Estimated Creatinine Clearance: 67 mL/min (by C-G formula based on SCr of 0.96 mg/dL).   Medical History: Past Medical History:  Diagnosis Date  . Anemia, macrocytic   . Coronary artery disease   . Hyperlipidemia   . 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 LIMA to LAD, SVG to OM, SVG to RPDA  . Tobacco abuse   . Vitamin B12 deficiency     Medications:  Infusions:  . sodium chloride 10 mL/hr at 02/28/18 0700  . sodium chloride 250 mL (02/28/18 0700)    Assessment: 52 yo female admitted with STEMI, found with significant CAD, IABP placed in cath lab.  Pharmacy asked to start IV Heparin for IABP.  Per RN, IABP site looks okay.  No overt bleeding or complications noted.  Goal of Therapy:  Heparin level 0.2-0.5 Monitor platelets by anticoagulation protocol: Yes   Plan:  -Start IV heparin at 750 units/hr. -Check heparin level 6 hrs after gtt started -Daily heparin level and CBC. -F/u plans to pull IABP eventually.  Tad MooreJessica Damani Kelemen, Pharm D, BCPS  Clinical Pharmacist Pager [phone]  02/28/2018 7:38 AM

## 2018-03-01 ENCOUNTER — Other Ambulatory Visit (HOSPITAL_COMMUNITY): Payer: Self-pay

## 2018-03-01 ENCOUNTER — Encounter (HOSPITAL_COMMUNITY): Payer: Self-pay | Admitting: Cardiovascular Disease

## 2018-03-01 ENCOUNTER — Inpatient Hospital Stay (HOSPITAL_COMMUNITY): Payer: Medicaid Other

## 2018-03-01 DIAGNOSIS — Z720 Tobacco use: Secondary | ICD-10-CM

## 2018-03-01 DIAGNOSIS — J189 Pneumonia, unspecified organism: Secondary | ICD-10-CM

## 2018-03-01 DIAGNOSIS — E7800 Pure hypercholesterolemia, unspecified: Secondary | ICD-10-CM

## 2018-03-01 DIAGNOSIS — I2129 ST elevation (STEMI) myocardial infarction involving other sites: Principal | ICD-10-CM

## 2018-03-01 HISTORY — DX: Pneumonia, unspecified organism: J18.9

## 2018-03-01 LAB — CBC
HEMATOCRIT: 41.4 % (ref 36.0–46.0)
Hemoglobin: 13.8 g/dL (ref 12.0–15.0)
MCH: 30.2 pg (ref 26.0–34.0)
MCHC: 33.3 g/dL (ref 30.0–36.0)
MCV: 90.6 fL (ref 78.0–100.0)
Platelets: 237 10*3/uL (ref 150–400)
RBC: 4.57 MIL/uL (ref 3.87–5.11)
RDW: 14 % (ref 11.5–15.5)
WBC: 16.5 10*3/uL — ABNORMAL HIGH (ref 4.0–10.5)

## 2018-03-01 LAB — HEPARIN LEVEL (UNFRACTIONATED)
HEPARIN UNFRACTIONATED: 0.16 [IU]/mL — AB (ref 0.30–0.70)
Heparin Unfractionated: <0.1 IU/mL — ABNORMAL LOW (ref 0.30–0.70)

## 2018-03-01 LAB — TROPONIN I

## 2018-03-01 LAB — COOXEMETRY PANEL
CARBOXYHEMOGLOBIN: 1.7 % — AB (ref 0.5–1.5)
Methemoglobin: 1 % (ref 0.0–1.5)
O2 Saturation: 71 %
TOTAL HEMOGLOBIN: 13.7 g/dL (ref 12.0–16.0)

## 2018-03-01 LAB — HIV ANTIBODY (ROUTINE TESTING W REFLEX): HIV Screen 4th Generation wRfx: NONREACTIVE

## 2018-03-01 MED ORDER — LORAZEPAM 0.5 MG PO TABS
0.5000 mg | ORAL_TABLET | Freq: Once | ORAL | Status: AC
  Administered 2018-03-01: 0.5 mg via ORAL
  Filled 2018-03-01: qty 1

## 2018-03-01 MED ORDER — SODIUM CHLORIDE 0.9 % IV SOLN
2.0000 g | INTRAVENOUS | Status: DC
  Administered 2018-03-01 – 2018-03-05 (×5): 2 g via INTRAVENOUS
  Filled 2018-03-01 (×5): qty 20

## 2018-03-01 MED ORDER — LORAZEPAM 1 MG PO TABS
1.0000 mg | ORAL_TABLET | Freq: Once | ORAL | Status: AC
  Administered 2018-03-01: 1 mg via ORAL
  Filled 2018-03-01: qty 1

## 2018-03-01 MED ORDER — AZITHROMYCIN 500 MG PO TABS
500.0000 mg | ORAL_TABLET | Freq: Every day | ORAL | Status: AC
  Administered 2018-03-01: 500 mg via ORAL
  Filled 2018-03-01: qty 1

## 2018-03-01 MED ORDER — AZITHROMYCIN 250 MG PO TABS
250.0000 mg | ORAL_TABLET | Freq: Every day | ORAL | Status: AC
  Administered 2018-03-02 – 2018-03-05 (×4): 250 mg via ORAL
  Filled 2018-03-01 (×4): qty 1

## 2018-03-01 NOTE — Progress Notes (Signed)
ANTICOAGULATION CONSULT NOTE   Pharmacy Consult for IV heparin Indication: IABP  Allergies  Allergen Reactions  . Codeine Nausea And Vomiting  . Statins     Patient says cannot tolerate statins d/t muscle cramping    Patient Measurements: Height: 5\' 3"  (160 cm) Weight: 160 lb 0.9 oz (72.6 kg) IBW/kg (Calculated) : 52.4 Heparin Dosing Weight: 67.6 kg  Vital Signs: Temp: 99.9 F (37.7 C) (03/15 2053) BP: 131/87 (03/15 2021) Pulse Rate: 79 (03/15 2053)  Labs: Recent Labs    02/28/18 0247 02/28/18 0337 02/28/18 1028 02/28/18 1126 02/28/18 1400 02/28/18 1726 03/01/18 0032  HGB 13.4 12.9 13.8  --   --   --   --   HCT 40.0 38.0 41.3  --   --   --   --   PLT 306  --  311  --   --   --   --   APTT 60*  --   --   --   --   --   --   LABPROT 12.8  --   --   --   --   --   --   INR 0.97  --   --   --   --   --   --   HEPARINUNFRC  --   --   --   --  <0.10*  --  <0.10*  CREATININE 0.96 0.70 0.89  --   --   --   --   TROPONINI 0.04*  --   --  19.37*  --  47.17*  --     Estimated Creatinine Clearance: 72.2 mL/min (by C-G formula based on SCr of 0.89 mg/dL).   Medical History: Past Medical History:  Diagnosis Date  . Anemia, macrocytic   . Coronary artery disease   . Hyperlipidemia   . 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 LIMA to LAD, SVG to OM, SVG to RPDA  . Tobacco abuse   . Vitamin B12 deficiency     Medications:  Infusions:  . sodium chloride 10 mL/hr at 02/28/18 2300  . sodium chloride 250 mL (02/28/18 2300)  . heparin 950 Units/hr (02/28/18 2300)    Assessment: 52 yo female admitted with STEMI, found with significant CAD, IABP placed in cath lab.  Pharmacy asked to start IV Heparin for IABP.  3/16 AM: heparin level remains undetectable, may wean IABP later today  Goal of Therapy:  Heparin level 0.2-0.5 Monitor platelets by anticoagulation protocol: Yes   Plan:  Inc heparin to 1100 units/hr 0900 HL  Abran DukeJames Clytie Shetley, PharmD, BCPS Clinical  Pharmacist Phone: [phone]

## 2018-03-01 NOTE — Progress Notes (Signed)
Pt with greater than 400 ml in bladder per scan. I&O cath using by Greig CastillaFlorentina Parra sterile technique. 350 retrieved from bladder. AutolivWilliam Camille Thau assisted.

## 2018-03-01 NOTE — Progress Notes (Addendum)
ANTICOAGULATION CONSULT NOTE   Pharmacy Consult for IV heparin Indication: IABP  Allergies  Allergen Reactions  . Codeine Nausea And Vomiting  . Statins     Patient says cannot tolerate statins d/t muscle cramping    Patient Measurements: Height: 5\' 3"  (160 cm) Weight: 160 lb 0.9 oz (72.6 kg) IBW/kg (Calculated) : 52.4 Heparin Dosing Weight: 67.6 kg  Vital Signs: Temp: 100 F (37.8 C) (03/16 1100) Pulse Rate: 80 (03/16 1100)  Labs: Recent Labs    02/28/18 0247 02/28/18 0337 02/28/18 1028 02/28/18 1126 02/28/18 1400 02/28/18 1726 03/01/18 0032 03/01/18 0505 03/01/18 0926  HGB 13.4 12.9 13.8  --   --   --   --  13.8  --   HCT 40.0 38.0 41.3  --   --   --   --  41.4  --   PLT 306  --  311  --   --   --   --  237  --   APTT 60*  --   --   --   --   --   --   --   --   LABPROT 12.8  --   --   --   --   --   --   --   --   INR 0.97  --   --   --   --   --   --   --   --   HEPARINUNFRC  --   --   --   --  <0.10*  --  <0.10*  --  <0.10*  CREATININE 0.96 0.70 0.89  --   --   --   --   --   --   TROPONINI 0.04*  --   --  19.37*  --  47.17* >65.00*  --   --     Estimated Creatinine Clearance: 72.2 mL/min (by C-G formula based on SCr of 0.89 mg/dL).   Medical History: Past Medical History:  Diagnosis Date  . Anemia, macrocytic   . Community acquired pneumonia 03/01/2018  . Coronary artery disease   . Hyperlipidemia   . 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 LIMA to LAD, SVG to OM, SVG to RPDA  . Tobacco abuse   . Vitamin B12 deficiency     Medications:  Infusions:  . sodium chloride 10 mL/hr at 03/01/18 0900  . sodium chloride 250 mL (03/01/18 0900)  . cefTRIAXone (ROCEPHIN)  IV Stopped (03/01/18 1137)  . heparin 1,100 Units/hr (03/01/18 0900)    Assessment: 52 yo female admitted with STEMI, found with significant CAD, IABP placed in cath lab. Pharmacy asked to start IV Heparin for IABP.  3/16 AM: heparin level remains undetectable on repeat draw. No bleeding  issues noted. Hgb stable but pltc down this am 311>>237.   Goal of Therapy:  Heparin level 0.2-0.5 Monitor platelets by anticoagulation protocol: Yes   Plan:  Inc heparin to 1400 units/hr 1800 HL  Sheppard CoilFrank Glenda Spelman PharmD., BCPS Clinical Pharmacist 03/01/2018 12:01 PM

## 2018-03-01 NOTE — Progress Notes (Signed)
ANTICOAGULATION CONSULT NOTE   Pharmacy Consult for IV heparin Indication: IABP  Allergies  Allergen Reactions  . Codeine Nausea And Vomiting  . Statins     Patient says cannot tolerate statins d/t muscle cramping    Patient Measurements: Height: 5\' 3"  (160 cm) Weight: 160 lb 0.9 oz (72.6 kg) IBW/kg (Calculated) : 52.4 Heparin Dosing Weight: 67.6 kg  Labs: Recent Labs    02/28/18 0247 02/28/18 0337 02/28/18 1028 02/28/18 1126  02/28/18 1726 03/01/18 0032 03/01/18 0505 03/01/18 0926 03/01/18 1830  HGB 13.4 12.9 13.8  --   --   --   --  13.8  --   --   HCT 40.0 38.0 41.3  --   --   --   --  41.4  --   --   PLT 306  --  311  --   --   --   --  237  --   --   APTT 60*  --   --   --   --   --   --   --   --   --   LABPROT 12.8  --   --   --   --   --   --   --   --   --   INR 0.97  --   --   --   --   --   --   --   --   --   HEPARINUNFRC  --   --   --   --    < >  --  <0.10*  --  <0.10* 0.16*  CREATININE 0.96 0.70 0.89  --   --   --   --   --   --   --   TROPONINI 0.04*  --   --  19.37*  --  47.17* >65.00*  --   --   --    < > = values in this interval not displayed.   Estimated Creatinine Clearance: 72.2 mL/min (by C-G formula based on SCr of 0.89 mg/dL).  Assessment: 52 yo female admitted with STEMI, found with significant CAD, IABP placed in cath lab. Pharmacy asked to start IV Heparin for IABP.  3/16 AM: heparin level remains undetectable on repeat draw. No bleeding issues noted. Hgb stable but pltc down this am 311>>237.  3/16 PM:  Heparin level 0.16 and just below desired goal on current rate.  No noted bleeding.  Goal of Therapy:  Heparin level 0.2-0.5 Monitor platelets by anticoagulation protocol: Yes   Plan:  Inc heparin to 1500 units/hr Recheck AM HL/CBC Monitor for bleeding complications  Nadara MustardNita Jiyan Walkowski, PharmD., MS Clinical Pharmacist Pager:  [phone] Thank you for allowing pharmacy to be part of this patients care team. 03/01/2018 7:17 PM

## 2018-03-01 NOTE — Progress Notes (Addendum)
Progress Note  Patient Name: Kelsey Padilla Date of Encounter: 03/01/2018  Primary Cardiologist: No primary care provider on file.   Subjective   Feeling better.  Nausea has improved but no appetite.  Reports cough for 6-7 weeks that is occasionally productive.    Inpatient Medications    Scheduled Meds: . aspirin  81 mg Oral Daily  . atorvastatin  80 mg Oral q1800  . Chlorhexidine Gluconate Cloth  6 each Topical Daily  . mouth rinse  15 mL Mouth Rinse BID  . metoprolol tartrate  25 mg Oral BID  . sodium chloride flush  10-40 mL Intracatheter Q12H  . sodium chloride flush  3 mL Intravenous Q12H  . ticagrelor  90 mg Oral BID   Continuous Infusions: . sodium chloride 10 mL/hr at 03/01/18 0700  . sodium chloride 250 mL (03/01/18 0700)  . heparin 1,100 Units/hr (03/01/18 0700)   PRN Meds: sodium chloride, acetaminophen, nitroGLYCERIN, ondansetron (ZOFRAN) IV, senna-docusate, sodium chloride flush, sodium chloride flush   Vital Signs    Vitals:   03/01/18 0500 03/01/18 0600 03/01/18 0700 03/01/18 0800  BP:      Pulse: 85 73 85 82  Resp: (!) 28 (!) 38 (!) 32 (!) 34  Temp: 99.7 F (37.6 C) 99.7 F (37.6 C) 99.7 F (37.6 C) 100 F (37.8 C)  TempSrc:      SpO2: 93% 94% 92% 94%  Weight:      Height:        Intake/Output Summary (Last 24 hours) at 03/01/2018 0821 Last data filed at 03/01/2018 0700 Gross per 24 hour  Intake 1090.5 ml  Output 1350 ml  Net -259.5 ml   Filed Weights   02/28/18 0246 02/28/18 0545  Weight: 160 lb (72.6 kg) 160 lb 0.9 oz (72.6 kg)    Telemetry    Sinus rhythm.  Occasional PVCs.  - Personally Reviewed  ECG    02/28/18: Sinus rhythm.  Rate 73 bpm.  Inferolateral STEMI. - Personally Reviewed  Physical Exam   VS:  BP 131/87   Pulse 82   Temp 100 F (37.8 C)   Resp (!) 34   Ht 5\' 3"  (1.6 m)   Wt 160 lb 0.9 oz (72.6 kg)   SpO2 94%   BMI 28.35 kg/m  , BMI Body mass index is 28.35 kg/m. GENERAL:  Ill-appearing HEENT: Pupils  equal round and reactive, fundi not visualized, oral mucosa unremarkable NECK:  No jugular venous distention, waveform within normal limits, carotid upstroke brisk and symmetric, no bruits LUNGS:  Clear to auscultation bilaterally HEART:  RRR.  PMI not displaced or sustained,S1 and S2 within normal limits, no S3, no S4, no clicks, no rubs, no murmurs ABD:  Flat, positive bowel sounds normal in frequency in pitch, no bruits, no rebound, no guarding, no midline pulsatile mass, no hepatomegaly, no splenomegaly EXT:  2 plus pulses throughout, no edema, no cyanosis no clubbing SKIN:  No rashes no nodules NEURO:  Cranial nerves II through XII grossly intact, motor grossly intact throughout PSYCH:  Cognitively intact, oriented to person place and time   Labs    Chemistry Recent Labs  Lab 02/28/18 0247 02/28/18 0337 02/28/18 1028  NA 137 139 135  K 3.9 3.5 4.4  CL 104 105 103  CO2 18*  --  19*  GLUCOSE 183* 164* 139*  BUN 12 12 13   CREATININE 0.96 0.70 0.89  CALCIUM 8.8*  --  9.0  PROT 6.1*  --   --  ALBUMIN 3.3*  --   --   AST 33  --   --   ALT 30  --   --   ALKPHOS 68  --   --   BILITOT 0.5  --   --   GFRNONAA >60  --  >60  GFRAA >60  --  >60  ANIONGAP 15  --  13     Hematology Recent Labs  Lab 02/28/18 0247 02/28/18 0337 02/28/18 1028 03/01/18 0505  WBC 10.2  --  16.1* 16.5*  RBC 4.35  --  4.56 4.57  HGB 13.4 12.9 13.8 13.8  HCT 40.0 38.0 41.3 41.4  MCV 92.0  --  90.6 90.6  MCH 30.8  --  30.3 30.2  MCHC 33.5  --  33.4 33.3  RDW 14.0  --  13.7 14.0  PLT 306  --  311 237    Cardiac Enzymes Recent Labs  Lab 02/28/18 0247 02/28/18 1126 02/28/18 1726 03/01/18 0032  TROPONINI 0.04* 19.37* 47.17* >65.00*    Recent Labs  Lab 02/28/18 0254  TROPIPOC 0.02     BNPNo results for input(s): BNP, PROBNP in the last 168 hours.   DDimer No results for input(s): DDIMER in the last 168 hours.   Radiology    Dg Chest Port 1 View  Result Date: 03/01/2018 CLINICAL  DATA:  Intra-aortic balloon pump. EXAM: PORTABLE CHEST 1 VIEW COMPARISON:  02/28/2018. FINDINGS: The heart is enlarged. Unchanged position of a IABP. Increasing BILATERAL pulmonary opacities suggest developing edema or pneumonia. These are worse on the RIGHT. No effusion or volume loss. Swan-Ganz catheter tip LEFT pulmonary artery. IMPRESSION: Worsening aeration. Increasing BILATERAL pulmonary opacities, greater on the RIGHT. This could represent pulmonary edema or BILATERAL pneumonia. Electronically Signed   By: Elsie Stain M.D.   On: 03/01/2018 07:23   Dg Chest Portable 1 View  Result Date: 02/28/2018 CLINICAL DATA:  Non ST-elevation myocardial infarction, acute onset. EXAM: PORTABLE CHEST 1 VIEW COMPARISON:  Chest radiograph performed 09/11/2011 FINDINGS: The lungs are well-aerated. Pulmonary vascularity is at the upper limits of normal. Mild retrocardiac opacity may reflect atelectasis. There is no evidence of pleural effusion or pneumothorax. The cardiomediastinal silhouette is normal in size. The patient is status post median sternotomy. No acute osseous abnormalities are seen. IMPRESSION: Mild retrocardiac opacity may reflect atelectasis. Lungs otherwise clear. Electronically Signed   By: Roanna Raider M.D.   On: 02/28/2018 06:43    Cardiac Studies   Cardiac cath on 02-28-2018:  Prox RCA to Mid RCA lesion is 75% stenosed.  Dist RCA lesion is 100% stenosed. SVG to PDA is occluded.  Mid LM to Ost LAD lesion is 75% stenosed.  Prox LAD to Mid LAD lesion is 100% stenosed. LIMA to LAD.  Origin to Prox Graft lesion is 40% stenosed.  Ost 2nd Mrg lesion is 100% stenosed. SVG to OM Dist Graft to Insertion lesion is 100% stenosed. This is the culprit.  A drug-eluting stent was successfully placed using a STENT SYNERGY DES 3.5X38.  Post intervention, there is a 0% residual stenosis. However, microvascular occlusion will cause ongoing injury.  The left ventricular ejection fraction is 25-35%  by visual estimate.  There is moderate to severe left ventricular systolic dysfunction.  LV end diastolic pressure is moderately elevated.  There is no aortic valve stenosis.  Ao sat 98%, PA sat 61%, PA mean 18 mm Hg; PCWP 17 mm Hg  IABP placed  Decreased EF increased LVEDP. BP stable. IABP placed given ongoing  ischemia due to no reflow.  DAPT for 1 year. She will need aggressive secondary prevention and CHF management.     Patient Profile     52 y.o. female with CAD s/p CABG (2012, LIMA to LAD, SVG to OM and SVG to RPDA), hyperlipidemia, and tobacco abuse here with STEMI.  Assessment & Plan    # CAD s/p CABG:  # STEMI: # Hyperlipidemia:  Presented with inferolateral STEMI.  LIMA was patent.  SVG to RCA was occluded.  DES placed in the OM2.  However there was TIMI 0 flow after stent placement.  IABP in place and in proper position.  Troponin continues to rise and was >65.  Continue aspirin, ti atorvastatin, heparin, and metoprolol.   # Acute systolic and diastolic heart failure: LVEF 25-35% by LVgram.  Echo pending.  IABP in place.  CXR concerning for worsening pulmonary edema vs. Infiltrate.  Check co-ox.  CVP was 8 and IABP MAP 110s.  Without augmentation MAP was in the 90s. Will start losartan 25mg  daily.    # CAP: WBC elevated and she has been with productive cough for weeks.  CXR concerning for infiltrate.  Will start ceftriaxone and azithromycin.  Check blood and sputum cultures.   # Tobacco abuse: Cessation will be very important.   # Urinary retention: Several in/out caths. If she requires another will place foley until balloon pump is out.    Time spent: 45 minutes-Greater than 50% of this time was spent in counseling, explanation of diagnosis, planning of further management, and coordination of care.   For questions or updates, please contact [facility] Please consult [email or web address] for contact info under Cardiology/STEMI.      Signed, Chilton Si,  MD  03/01/2018, 8:21 AM

## 2018-03-02 ENCOUNTER — Inpatient Hospital Stay (HOSPITAL_COMMUNITY): Payer: Medicaid Other

## 2018-03-02 DIAGNOSIS — R570 Cardiogenic shock: Secondary | ICD-10-CM

## 2018-03-02 LAB — RESPIRATORY PANEL BY PCR
ADENOVIRUS-RVPPCR: NOT DETECTED
Bordetella pertussis: NOT DETECTED
CORONAVIRUS NL63-RVPPCR: NOT DETECTED
Chlamydophila pneumoniae: NOT DETECTED
Coronavirus 229E: NOT DETECTED
Coronavirus HKU1: NOT DETECTED
Coronavirus OC43: NOT DETECTED
INFLUENZA A-RVPPCR: NOT DETECTED
INFLUENZA B-RVPPCR: NOT DETECTED
MYCOPLASMA PNEUMONIAE-RVPPCR: NOT DETECTED
Metapneumovirus: NOT DETECTED
PARAINFLUENZA VIRUS 3-RVPPCR: NOT DETECTED
PARAINFLUENZA VIRUS 4-RVPPCR: NOT DETECTED
Parainfluenza Virus 1: NOT DETECTED
Parainfluenza Virus 2: NOT DETECTED
RESPIRATORY SYNCYTIAL VIRUS-RVPPCR: NOT DETECTED
RHINOVIRUS / ENTEROVIRUS - RVPPCR: NOT DETECTED

## 2018-03-02 LAB — BASIC METABOLIC PANEL
ANION GAP: 8 (ref 5–15)
BUN: 22 mg/dL — AB (ref 6–20)
CALCIUM: 8.5 mg/dL — AB (ref 8.9–10.3)
CO2: 21 mmol/L — AB (ref 22–32)
Chloride: 107 mmol/L (ref 101–111)
Creatinine, Ser: 0.82 mg/dL (ref 0.44–1.00)
GFR calc Af Amer: >60 mL/min (ref >60)
GLUCOSE: 152 mg/dL — AB (ref 65–99)
Potassium: 3.6 mmol/L (ref 3.5–5.1)
Sodium: 136 mmol/L (ref 135–145)

## 2018-03-02 LAB — CBC
HEMATOCRIT: 38 % (ref 36.0–46.0)
Hemoglobin: 12.7 g/dL (ref 12.0–15.0)
MCH: 30.1 pg (ref 26.0–34.0)
MCHC: 33.4 g/dL (ref 30.0–36.0)
MCV: 90 fL (ref 78.0–100.0)
PLATELETS: 179 10*3/uL (ref 150–400)
RBC: 4.22 MIL/uL (ref 3.87–5.11)
RDW: 13.4 % (ref 11.5–15.5)
WBC: 16.7 10*3/uL — ABNORMAL HIGH (ref 4.0–10.5)

## 2018-03-02 LAB — TROPONIN I: Troponin I: >65 ng/mL (ref <0.03)

## 2018-03-02 LAB — ECHOCARDIOGRAM COMPLETE
Height: 63 in
Weight: 2560.86 oz

## 2018-03-02 LAB — HEPARIN LEVEL (UNFRACTIONATED): Heparin Unfractionated: 0.23 IU/mL — ABNORMAL LOW (ref 0.30–0.70)

## 2018-03-02 MED ORDER — ACETAMINOPHEN 325 MG PO TABS
650.0000 mg | ORAL_TABLET | Freq: Four times a day (QID) | ORAL | Status: DC | PRN
  Administered 2018-03-02 – 2018-03-05 (×5): 650 mg via ORAL
  Filled 2018-03-02 (×5): qty 2

## 2018-03-02 MED ORDER — ALPRAZOLAM 0.5 MG PO TABS
0.5000 mg | ORAL_TABLET | Freq: Three times a day (TID) | ORAL | Status: DC | PRN
  Administered 2018-03-02 – 2018-03-05 (×7): 0.5 mg via ORAL
  Filled 2018-03-02 (×7): qty 1

## 2018-03-02 MED ORDER — ALPRAZOLAM 0.5 MG PO TABS: 0.5000 mg | ORAL_TABLET | Freq: Every evening | ORAL | Status: DC | PRN

## 2018-03-02 MED ORDER — HYDROCOD POLST-CPM POLST ER 10-8 MG/5ML PO SUER: 5.0000 mL | Freq: Two times a day (BID) | ORAL | Status: DC | PRN

## 2018-03-02 MED ORDER — GUAIFENESIN-DM 100-10 MG/5ML PO SYRP
5.0000 mL | ORAL_SOLUTION | ORAL | Status: DC | PRN
  Administered 2018-03-02 – 2018-03-05 (×8): 5 mL via ORAL
  Filled 2018-03-02 (×9): qty 5

## 2018-03-02 NOTE — Progress Notes (Signed)
  Echocardiogram 2D Echocardiogram has been performed.  Kelsey Padilla, Kelsey Padilla 03/02/2018, 9:40 AM

## 2018-03-02 NOTE — Progress Notes (Signed)
All respiratory virus resulted not detected, droplet precautions D/C'd. Pt informed of results.

## 2018-03-02 NOTE — Progress Notes (Addendum)
Progress Note  Patient Name: Kelsey Padilla Date of Encounter: 03/02/2018  Primary Cardiologist: No primary care provider on file.   Subjective   Feeling well.  No chest pain or shortness of breath.   Inpatient Medications    Scheduled Meds: . aspirin  81 mg Oral Daily  . atorvastatin  80 mg Oral q1800  . azithromycin  250 mg Oral Daily  . Chlorhexidine Gluconate Cloth  6 each Topical Daily  . mouth rinse  15 mL Mouth Rinse BID  . metoprolol tartrate  25 mg Oral BID  . sodium chloride flush  10-40 mL Intracatheter Q12H  . sodium chloride flush  3 mL Intravenous Q12H  . ticagrelor  90 mg Oral BID   Continuous Infusions: . sodium chloride 10 mL/hr at 03/01/18 1900  . sodium chloride 250 mL (03/02/18 0700)  . cefTRIAXone (ROCEPHIN)  IV 2 g (03/02/18 0934)  . heparin 1,500 Units/hr (03/02/18 0800)   PRN Meds: sodium chloride, acetaminophen, nitroGLYCERIN, ondansetron (ZOFRAN) IV, senna-docusate, sodium chloride flush, sodium chloride flush   Vital Signs    Vitals:   03/02/18 0500 03/02/18 0600 03/02/18 0700 03/02/18 0800  BP:      Pulse: 87 88 94 91  Resp: (!) 44 (!) 38 (!) 26 (!) 29  Temp: (!) 100.8 F (38.2 C) (!) 100.4 F (38 C) 100.2 F (37.9 C) 99.9 F (37.7 C)  TempSrc:    Core (Comment)  SpO2: 95% 97% 98% 92%  Weight:      Height:        Intake/Output Summary (Last 24 hours) at 03/02/2018 0945 Last data filed at 03/02/2018 0800 Gross per 24 hour  Intake 985.25 ml  Output 640 ml  Net 345.25 ml   Filed Weights   02/28/18 0246 02/28/18 0545  Weight: 160 lb (72.6 kg) 160 lb 0.9 oz (72.6 kg)    Telemetry    Sinus rhythm.  Occasional PVCs.  - Personally Reviewed  ECG    02/28/18: Sinus rhythm.  Rate 73 bpm.  Inferolateral STEMI. - Personally Reviewed  Physical Exam   VS:  BP 131/87   Pulse 91   Temp 99.9 F (37.7 C) (Core (Comment)) Comment (Src): Swan  Resp (!) 29   Ht 5\' 3"  (1.6 m)   Wt 160 lb 0.9 oz (72.6 kg)   SpO2 92%   BMI 28.35  kg/m  , BMI Body mass index is 28.35 kg/m. GENERAL:  Ill-appearing HEENT: Pupils equal round and reactive, fundi not visualized, oral mucosa unremarkable NECK:  No jugular venous distention, waveform within normal limits, carotid upstroke brisk and symmetric, no bruits LUNGS:  Clear to auscultation bilaterally HEART:  RRR.  PMI not displaced or sustained,S1 and S2 within normal limits, no S3, no S4, no clicks, no rubs, no murmurs ABD:  Flat, positive bowel sounds normal in frequency in pitch, no bruits, no rebound, no guarding, no midline pulsatile mass, no hepatomegaly, no splenomegaly EXT:  2 plus pulses throughout, no edema, no cyanosis no clubbing SKIN:  No rashes no nodules NEURO:  Cranial nerves II through XII grossly intact, motor grossly intact throughout [facility]:  Cognitively intact, oriented to person place and time   Labs    Chemistry Recent Labs  Lab 02/28/18 0247 02/28/18 0337 02/28/18 1028 03/02/18 0315  NA 137 139 135 136  K 3.9 3.5 4.4 3.6  CL 104 105 103 107  CO2 18*  --  19* 21*  GLUCOSE 183* 164* 139* 152*  BUN  12 12 13  22*  CREATININE 0.96 0.70 0.89 0.82  CALCIUM 8.8*  --  9.0 8.5*  PROT 6.1*  --   --   --   ALBUMIN 3.3*  --   --   --   AST 33  --   --   --   ALT 30  --   --   --   ALKPHOS 68  --   --   --   BILITOT 0.5  --   --   --   GFRNONAA >60  --  >60 >60  GFRAA >60  --  >60 >60  ANIONGAP 15  --  13 8     Hematology Recent Labs  Lab 02/28/18 1028 03/01/18 0505 03/02/18 0315  WBC 16.1* 16.5* 16.7*  RBC 4.56 4.57 4.22  HGB 13.8 13.8 12.7  HCT 41.3 41.4 38.0  MCV 90.6 90.6 90.0  MCH 30.3 30.2 30.1  MCHC 33.4 33.3 33.4  RDW 13.7 14.0 13.4  PLT 311 237 179    Cardiac Enzymes Recent Labs  Lab 02/28/18 0247 02/28/18 1126 02/28/18 1726 03/01/18 0032  TROPONINI 0.04* 19.37* 47.17* >65.00*    Recent Labs  Lab 02/28/18 0254  TROPIPOC 0.02     BNPNo results for input(s): BNP, PROBNP in the last 168 hours.   DDimer No results for  input(s): DDIMER in the last 168 hours.   Radiology    Dg Chest Port 1 View  Result Date: 03/01/2018 CLINICAL DATA:  Intra-aortic balloon pump. EXAM: PORTABLE CHEST 1 VIEW COMPARISON:  02/28/2018. FINDINGS: The heart is enlarged. Unchanged position of a IABP. Increasing BILATERAL pulmonary opacities suggest developing edema or pneumonia. These are worse on the RIGHT. No effusion or volume loss. Swan-Ganz catheter tip LEFT pulmonary artery. IMPRESSION: Worsening aeration. Increasing BILATERAL pulmonary opacities, greater on the RIGHT. This could represent pulmonary edema or BILATERAL pneumonia. Electronically Signed   By: Elsie StainJohn T Curnes M.D.   On: 03/01/2018 07:23    Cardiac Studies   Cardiac cath on 02-28-2018:  Prox RCA to Mid RCA lesion is 75% stenosed.  Dist RCA lesion is 100% stenosed. SVG to PDA is occluded.  Mid LM to Ost LAD lesion is 75% stenosed.  Prox LAD to Mid LAD lesion is 100% stenosed. LIMA to LAD.  Origin to Prox Graft lesion is 40% stenosed.  Ost 2nd Mrg lesion is 100% stenosed. SVG to OM Dist Graft to Insertion lesion is 100% stenosed. This is the culprit.  A drug-eluting stent was successfully placed using a STENT SYNERGY DES 3.5X38.  Post intervention, there is a 0% residual stenosis. However, microvascular occlusion will cause ongoing injury.  The left ventricular ejection fraction is 25-35% by visual estimate.  There is moderate to severe left ventricular systolic dysfunction.  LV end diastolic pressure is moderately elevated.  There is no aortic valve stenosis.  Ao sat 98%, PA sat 61%, PA mean 18 mm Hg; PCWP 17 mm Hg  IABP placed  Decreased EF increased LVEDP. BP stable. IABP placed given ongoing ischemia due to no reflow.  DAPT for 1 year. She will need aggressive secondary prevention and CHF management.     Patient Profile     52 y.o. female with CAD s/p CABG (2012, LIMA to LAD, SVG to OM and SVG to RPDA), hyperlipidemia, and tobacco abuse  here with STEMI.  Assessment & Plan    # CAD s/p CABG:  # STEMI: # Hyperlipidemia:  Presented with inferolateral STEMI.  LIMA was patent.  SVG to RCA was occluded.  DES placed in the OM2.  However there was TIMI 0 flow after stent placement.  IABP in place and in proper position.  Troponin was >65. Will repeat today. Continue aspirin, ticagrelor, atorvastatin, heparin, and metoprolol.   # Acute systolic and diastolic heart failure: LVEF 25-35% by LVgram.  Echo has been performed and pending read.  IABP in place.  CXR concerning for worsening pulmonary edema vs. Infiltrate.  Co-ox was 71 on 3/16.  CVP was 8 and IABP MAP 90s-110s.  Wean IABP to 2:1.  Likely d/c later today.  # CAP: WBC elevated and she has been with productive cough for weeks.  CXR concerning for infiltrate.  Will start ceftriaxone and azithromycin.  Check blood and sputum cultures.   # Tobacco abuse: Cessation will be very important.   # Urinary retention: Several in/out caths. If she requires another will place foley until balloon pump is out.    Time spent: 40 minutes-Greater than 50% of this time was spent in counseling, explanation of diagnosis, planning of further management, and coordination of care.   For questions or updates, please contact [facility] Please consult [email or web address] for contact info under Cardiology/STEMI.      Signed, Chilton Si, MD  03/02/2018, 9:45 AM

## 2018-03-02 NOTE — Progress Notes (Signed)
ANTICOAGULATION CONSULT NOTE   Pharmacy Consult for IV heparin Indication: IABP  Allergies  Allergen Reactions  . Codeine Nausea And Vomiting  . Statins     Patient says cannot tolerate statins d/t muscle cramping    Patient Measurements: Height: 5\' 3"  (160 cm) Weight: 160 lb 0.9 oz (72.6 kg) IBW/kg (Calculated) : 52.4 Heparin Dosing Weight: 67.6 kg  Labs: Recent Labs    02/28/18 0247 02/28/18 0337 02/28/18 1028 02/28/18 1126  02/28/18 1726 03/01/18 0032 03/01/18 0505 03/01/18 0926 03/01/18 1830 03/02/18 0312 03/02/18 0315  HGB 13.4 12.9 13.8  --   --   --   --  13.8  --   --   --  12.7  HCT 40.0 38.0 41.3  --   --   --   --  41.4  --   --   --  38.0  PLT 306  --  311  --   --   --   --  237  --   --   --  179  APTT 60*  --   --   --   --   --   --   --   --   --   --   --   LABPROT 12.8  --   --   --   --   --   --   --   --   --   --   --   INR 0.97  --   --   --   --   --   --   --   --   --   --   --   HEPARINUNFRC  --   --   --   --    < >  --  <0.10*  --  <0.10* 0.16* 0.23*  --   CREATININE 0.96 0.70 0.89  --   --   --   --   --   --   --   --  0.82  TROPONINI 0.04*  --   --  19.37*  --  47.17* >65.00*  --   --   --   --   --    < > = values in this interval not displayed.   Estimated Creatinine Clearance: 78.4 mL/min (by C-G formula based on SCr of 0.82 mg/dL).  Assessment: 52 yo female admitted with STEMI, found with significant CAD, IABP placed in cath lab. Pharmacy asked to start IV Heparin for IABP.  Heparin level this am is within range for IABP. Hgb trended down to 12.7(stable overall), pltc trended down to 179. No bleeding noted issues noted.   Goal of Therapy:  Heparin level 0.2-0.5 Monitor platelets by anticoagulation protocol: Yes   Plan:  Heparin at 1500 units/hr Follow wean of IABP  Sheppard CoilFrank Zyanya Glaza PharmD., BCPS Clinical Pharmacist 03/02/2018 10:17 AM

## 2018-03-02 NOTE — Plan of Care (Signed)
Pt with stable vital signs, IABP in place per Cardiology @ 1:1 rate and without vasopressor support. Rt femoral vascular site with IABP and femoral venous sheath Level zero.  Pt exhibiting moderate anxiety and restlessness, easily redirected however needing PO ativan for anxiety.  IABP remains in place per order, unable to return to baseline activity at this time.

## 2018-03-02 NOTE — Plan of Care (Signed)
  Progressing Pain Managment: General experience of comfort will improve 03/02/2018 0826 - Progressing by Marin CommentFrei, Mannix Kroeker C, RN Note Pt is not experiencing any pain. Skin Integrity: Risk for impaired skin integrity will decrease 03/02/2018 0826 - Progressing by Marin CommentFrei, Binyamin Nelis C, RN Note Pts sacral foam is clean, dry, and intact. Cardiovascular: Ability to achieve and maintain adequate cardiovascular perfusion will improve 03/02/2018 0826 - Progressing by Marin CommentFrei, Jory Welke C, RN Note Pt is tolerating IABP well with no complications and maintaining hemodynamic stability.  Vascular access site(s) Level 0-1 will be maintained 03/02/2018 0826 - Progressing by Marin CommentFrei, Nasreen Goedecke C, RN

## 2018-03-02 NOTE — Progress Notes (Signed)
IABP changed to 1:2 per Dr. Duke Salviaandolph.

## 2018-03-03 ENCOUNTER — Inpatient Hospital Stay (HOSPITAL_COMMUNITY): Payer: Medicaid Other

## 2018-03-03 LAB — CBC
HEMATOCRIT: 36.9 % (ref 36.0–46.0)
Hemoglobin: 12.2 g/dL (ref 12.0–15.0)
MCH: 30.3 pg (ref 26.0–34.0)
MCHC: 33.1 g/dL (ref 30.0–36.0)
MCV: 91.6 fL (ref 78.0–100.0)
PLATELETS: 189 10*3/uL (ref 150–400)
RBC: 4.03 MIL/uL (ref 3.87–5.11)
RDW: 13.4 % (ref 11.5–15.5)
WBC: 16.4 10*3/uL — AB (ref 4.0–10.5)

## 2018-03-03 LAB — BASIC METABOLIC PANEL
ANION GAP: 8 (ref 5–15)
BUN: 16 mg/dL (ref 6–20)
CO2: 20 mmol/L — ABNORMAL LOW (ref 22–32)
Calcium: 8.1 mg/dL — ABNORMAL LOW (ref 8.9–10.3)
Chloride: 106 mmol/L (ref 101–111)
Creatinine, Ser: 0.74 mg/dL (ref 0.44–1.00)
Glucose, Bld: 176 mg/dL — ABNORMAL HIGH (ref 65–99)
Potassium: 3.4 mmol/L — ABNORMAL LOW (ref 3.5–5.1)
SODIUM: 134 mmol/L — AB (ref 135–145)

## 2018-03-03 LAB — POCT ACTIVATED CLOTTING TIME: Activated Clotting Time: 147 seconds

## 2018-03-03 LAB — COOXEMETRY PANEL
Carboxyhemoglobin: 1.3 % (ref 0.5–1.5)
Methemoglobin: 1.5 % (ref 0.0–1.5)
O2 SAT: 69.4 %
TOTAL HEMOGLOBIN: 12.5 g/dL (ref 12.0–16.0)

## 2018-03-03 LAB — TROPONIN I: Troponin I: 22.35 ng/mL (ref <0.03)

## 2018-03-03 LAB — HEPARIN LEVEL (UNFRACTIONATED): HEPARIN UNFRACTIONATED: 0.18 [IU]/mL — AB (ref 0.30–0.70)

## 2018-03-03 MED ORDER — FUROSEMIDE 10 MG/ML IJ SOLN
40.0000 mg | Freq: Once | INTRAMUSCULAR | Status: AC
  Administered 2018-03-03: 40 mg via INTRAVENOUS
  Filled 2018-03-03: qty 4

## 2018-03-03 MED ORDER — POTASSIUM CHLORIDE 10 MEQ/50ML IV SOLN
10.0000 meq | INTRAVENOUS | Status: AC
  Administered 2018-03-03 (×4): 10 meq via INTRAVENOUS
  Filled 2018-03-03 (×4): qty 50

## 2018-03-03 MED ORDER — ENOXAPARIN SODIUM 40 MG/0.4ML ~~LOC~~ SOLN
40.0000 mg | Freq: Every day | SUBCUTANEOUS | Status: DC
  Administered 2018-03-03 – 2018-03-05 (×3): 40 mg via SUBCUTANEOUS
  Filled 2018-03-03 (×3): qty 0.4

## 2018-03-03 MED ORDER — LEVALBUTEROL HCL 0.63 MG/3ML IN NEBU
0.6300 mg | INHALATION_SOLUTION | Freq: Four times a day (QID) | RESPIRATORY_TRACT | Status: DC
  Administered 2018-03-03 – 2018-03-04 (×7): 0.63 mg via RESPIRATORY_TRACT
  Filled 2018-03-03 (×8): qty 3

## 2018-03-03 MED FILL — Nitroglycerin IV Soln 100 MCG/ML in D5W: INTRA_ARTERIAL | Qty: 10 | Status: AC

## 2018-03-03 NOTE — Progress Notes (Signed)
ANTICOAGULATION CONSULT NOTE   Pharmacy Consult for IV heparin Indication: IABP  Allergies  Allergen Reactions  . Codeine Nausea And Vomiting  . Statins     Patient says cannot tolerate statins d/t muscle cramping    Patient Measurements: Height: 5\' 3"  (160 cm) Weight: 160 lb 0.9 oz (72.6 kg) IBW/kg (Calculated) : 52.4 Heparin Dosing Weight: 67.6 kg  Labs: Recent Labs    02/28/18 1028  02/28/18 1726 03/01/18 0032 03/01/18 0505  03/01/18 1830 03/02/18 0312 03/02/18 0315 03/03/18 0341  HGB 13.8  --   --   --  13.8  --   --   --  12.7 12.2  HCT 41.3  --   --   --  41.4  --   --   --  38.0 36.9  PLT 311  --   --   --  237  --   --   --  179 189  HEPARINUNFRC  --    < >  --  <0.10*  --    < > 0.16* 0.23*  --  0.18*  CREATININE 0.89  --   --   --   --   --   --   --  0.82 0.74  TROPONINI  --    < > 47.17* >65.00*  --   --   --   --  >65.00*  --    < > = values in this interval not displayed.   Estimated Creatinine Clearance: 80.4 mL/min (by C-G formula based on SCr of 0.74 mg/dL).  Assessment: 52 yo female admitted with STEMI, found with significant CAD, IABP placed in cath lab. Pharmacy asked to start IV Heparin for IABP.  Heparin level subtherapeutic this AM with IABP goals (0.18).  CBC stable with no bleeding observed and no line issues per RN.    Goal of Therapy:  Heparin level 0.2-0.5 Monitor platelets by anticoagulation protocol: Yes   Plan:  Increase heparin gtt to 1600 units/hr F/u IABP wean F/u 1300 heparin level Monitor CBC, s/s bleeding  Daylene PoseyJonathan Jannice Beitzel, PharmD Pharmacy Resident Pager #: [phone] 03/03/2018 6:53 AM

## 2018-03-03 NOTE — Progress Notes (Signed)
On Call Cardiology made aware of soft BPs, but otherwise stable assessment, no orders received.

## 2018-03-03 NOTE — Plan of Care (Signed)
Pt anxiety level decreased with ordered Xanax and calming techniques. Occasional breakthrough panic attacks re-directed with reassurance  Pt noted to be having minimal urine output, discussing with MD. Foley placed per order and diuretic administered. Pt unable to utilize Pure wick external catheter when attempted.  Pt chest x-ray showing worsening CHF/Pulmonary edema, requiring increased O2 supplementation and addition of nebulizers. Persistent cough being treated with Robitussin.

## 2018-03-03 NOTE — Progress Notes (Signed)
Progress Note  Patient Name: Kelsey Padilla Date of Encounter: 03/03/2018  Primary Cardiologist: No primary care provider on file.   Subjective   The patient's admission improved from the prior day and continues to deny any chest pain.  Dyspnea is improved moderately but remains present with minimal activity.  Inpatient Medications    Scheduled Meds: . aspirin  81 mg Oral Daily  . atorvastatin  80 mg Oral q1800  . azithromycin  250 mg Oral Daily  . Chlorhexidine Gluconate Cloth  6 each Topical Daily  . levalbuterol  0.63 mg Nebulization Q6H  . mouth rinse  15 mL Mouth Rinse BID  . metoprolol tartrate  25 mg Oral BID  . sodium chloride flush  10-40 mL Intracatheter Q12H  . sodium chloride flush  3 mL Intravenous Q12H  . ticagrelor  90 mg Oral BID   Continuous Infusions: . sodium chloride 10 mL/hr at 03/01/18 1900  . sodium chloride 250 mL (03/03/18 0400)  . cefTRIAXone (ROCEPHIN)  IV 2 g (03/03/18 0923)   PRN Meds: sodium chloride, acetaminophen, ALPRAZolam, guaiFENesin-dextromethorphan, nitroGLYCERIN, ondansetron (ZOFRAN) IV, senna-docusate, sodium chloride flush, sodium chloride flush   Vital Signs    Vitals:   03/03/18 0800 03/03/18 0810 03/03/18 0816 03/03/18 0900  BP:  121/89    Pulse:      Resp: (!) 42 (!) 42  (!) 39  Temp: 99.7 F (37.6 C) 99.5 F (37.5 C) 99 F (37.2 C) 99.7 F (37.6 C)  TempSrc: Core  Core   SpO2: 95% 100%  100%  Weight:      Height:        Intake/Output Summary (Last 24 hours) at 03/03/2018 1113 Last data filed at 03/03/2018 0923 Gross per 24 hour  Intake 1646.07 ml  Output 1380 ml  Net 266.07 ml   Filed Weights   02/28/18 0246 02/28/18 0545  Weight: 72.6 kg (160 lb) 72.6 kg (160 lb 0.9 oz)    Telemetry    NSR with occasional PVCs - Personally Reviewed  ECG    Most recent ECG performed on 3/15 demonstrating continued ST elevation in leads II, III, aVF with reciprocal T wave inversion in V2 through V6- Personally  Reviewed  Physical Exam   GEN: No acute distress.  Resting comfortably in bed.  Alert and oriented x3 Neck: No JVD Cardiac: RRR, no murmurs, rubs, or gallops with balloon pump when possible.  Distal pulses intact Respiratory: Clear to auscultation bilaterally. GI: Soft, nontender, non-distended  MS: No edema; No deformity.  Catheter site clean and clear Neuro:  Nonfocal  Psych: Normal affect   Labs    Chemistry Recent Labs  Lab 02/28/18 0247  02/28/18 1028 03/02/18 0315 03/03/18 0341  NA 137   < > 135 136 134*  K 3.9   < > 4.4 3.6 3.4*  CL 104   < > 103 107 106  CO2 18*  --  19* 21* 20*  GLUCOSE 183*   < > 139* 152* 176*  BUN 12   < > 13 22* 16  CREATININE 0.96   < > 0.89 0.82 0.74  CALCIUM 8.8*  --  9.0 8.5* 8.1*  PROT 6.1*  --   --   --   --   ALBUMIN 3.3*  --   --   --   --   AST 33  --   --   --   --   ALT 30  --   --   --   --  ALKPHOS 68  --   --   --   --   BILITOT 0.5  --   --   --   --   GFRNONAA >60  --  >60 >60 >60  GFRAA >60  --  >60 >60 >60  ANIONGAP 15  --  13 8 8    < > = values in this interval not displayed.     Hematology Recent Labs  Lab 03/01/18 0505 03/02/18 0315 03/03/18 0341  WBC 16.5* 16.7* 16.4*  RBC 4.57 4.22 4.03  HGB 13.8 12.7 12.2  HCT 41.4 38.0 36.9  MCV 90.6 90.0 91.6  MCH 30.2 30.1 30.3  MCHC 33.3 33.4 33.1  RDW 14.0 13.4 13.4  PLT 237 179 189    Cardiac Enzymes Recent Labs  Lab 02/28/18 1126 02/28/18 1726 03/01/18 0032 03/02/18 0315  TROPONINI 19.37* 47.17* >65.00* >65.00*    Recent Labs  Lab 02/28/18 0254  TROPIPOC 0.02     BNPNo results for input(s): BNP, PROBNP in the last 168 hours.   DDimer No results for input(s): DDIMER in the last 168 hours.   Radiology    Dg Chest Port 1 View  Result Date: 03/03/2018 CLINICAL DATA:  Acute dyspnea EXAM: PORTABLE CHEST 1 VIEW COMPARISON:  03/01/2018 FINDINGS: Interval progression of confluent airspace opacities throughout both lungs either representing worsening  CHF or progression of multifocal pneumonia. Stable cardiomegaly partially obscured by adjacent airspace disease. Patient is status post median sternotomy. Swan-Ganz catheter is noted the left pulmonary artery. No acute osseous abnormalities visualized. IMPRESSION: Worsening of bilateral airspace opacities in setting cardiomegaly. Findings may represent progression of CHF or multifocal, multilobar pneumonia. Electronically Signed   By: Tollie Ethavid  Kwon M.D.   On: 03/03/2018 03:51    Cardiac Studies    Cardiac cath on 02-28-2018:  Prox RCA to Mid RCA lesion is 75% stenosed.  Dist RCA lesion is 100% stenosed. SVG to PDA is occluded.  Mid LM to Ost LAD lesion is 75% stenosed.  Prox LAD to Mid LAD lesion is 100% stenosed. LIMA to LAD.  Origin to Prox Graft lesion is 40% stenosed.  Ost 2nd Mrg lesion is 100% stenosed. SVG to OM Dist Graft to Insertion lesion is 100% stenosed. This is the culprit.  A drug-eluting stent was successfully placed using a STENT SYNERGY DES 3.5X38.  Post intervention, there is a 0% residual stenosis. However, microvascular occlusion will cause ongoing injury.  The left ventricular ejection fraction is 25-35% by visual estimate.  There is moderate to severe left ventricular systolic dysfunction.  LV end diastolic pressure is moderately elevated.  There is no aortic valve stenosis.  Ao sat 98%, PA sat 61%, PA mean 18 mm Hg; PCWP 17 mm Hg  IABP placed  Decreased EF increased LVEDP. BP stable. IABP placed given ongoing ischemia due to no reflow.  DAPT for 1 year. She will need aggressive secondary prevention and CHF management.  Echocardiogram on 03/03/2018: Study Conclusions  - Left ventricle: The cavity size was normal. Systolic function was   mildly reduced. The estimated ejection fraction was in the range   of 45% to 50%. Hypokinesis of the inferior myocardium. - Mitral valve: There was mild regurgitation.  Patient Profile     52 y.o. female with  CAD status post CABG 2012 with a LIMA to LAD, SVG to OM and SVG to RPDA, hyperlipidemia, continued tobacco abuse who presented to the ED with atypical STEMI.  She underwent cardiac catheterization which noted a patent LIMA,  the complete occlusion of both SVGs.  There was TIMI grade 0 flow after stent placement to SVG to the RCA.  Medical management was recommended.  IABP was placed given the ongoing ischemia due to no reflow.  Assessment & Plan    CAD status post CABG: STEMI: Hyperlipidemia:  This patient presented with anterolateral STEMI consummately underwent cardiac cath PCI to the SVG to the RCA.  There was TIMI grade 0 flow following stent placement.  She is not a candidate for repeat CABG for additional PCI.  As such from medical management is recommended.  Troponin peaked at greater than >65  -We will repeat troponin to determine if peak achieved -Continue high-dose atorvastatin 80mg  -Continue metoprolol 25mg  BID -Continue Brilinta and ASA times 1 year -Heparin discontinued below, plan to continue heparin therapy for VTE prophylaxis following IAPB catheter removal   -APB 2:1, given continued improvement in her cardiac output with cardiac index of 2.76 this was decreased to 3:1 support.  Heparin has been discontinued in anticipation of the balloon pump removal later today.  Acute systolic and diastolic heart failure: LVEF 20-35% by LV gram however, echocardiogram indicating 45-50% with hypokinesis of the inferior myocardium. CXR concerning for progression of CHF vs multifocal/multilobar pneumonia. No clear evidence of hypervolemia. Patient with small net negative volume status since admission. Overall clinical status improving with current treatment.   --Given a dose of Lasix 40mg  IV due to concern for pulmonary edema --Continue to monitor I/O's daily  Hyponatremia: Treated for runs of IV potassium overnight. -Repeat BMP in a.m.  Possible CAP: WBC elevated with productive cough for  weeks. CXR concerning for infiltrate read as possible pneumonia vs pulmonary edema.  --Continue ceftriaxone and azithromycin  --Sputum and blood Cx pending --Continue guaifenesin-dextromethorphan Q4 hours PRN --CBC in am  For questions or updates, please contact [facility] Please consult [email or web address] for contact info under Cardiology/STEMI.  Please see attending note/attestation for current assessment and plan.     Signed, Lanelle Bal, MD  03/03/2018, 11:13 AM   Pager# [phone]

## 2018-03-03 NOTE — Progress Notes (Signed)
Dr Cristal Deerhristopher (cards fellow) [facility] showing worsening [facility] vs pneumonia. Pt current care plan reviewed as well as hemodynamic and respiratory status. Orders received to place foley cath and diuretics with K+ replacement. Will continue to assess and observe.

## 2018-03-03 NOTE — Progress Notes (Signed)
697fr sheath aspirated and removed from rfv, manual pressure applied for 5 minutes. IABP removed from rfa. Manual pressure applied to both sites for 30 minutes. Groin level 0. No s+s of hematoma. Tegaderm dressing applied, bedrest instructions given. Left dp and pt pulses palpable. Right dp and pt present with doppler.   Bedrest begins at 12:40:00

## 2018-03-03 NOTE — Progress Notes (Signed)
Pt noted to have a new onset of acute dyspnea and anxiety, respiratory rate 30-35 with new upper lobe expiratory wheezes. O2 increased to 6 L [place] and re-assurence provided w/ calming techniques and support. Cardiology fellow notified and discussed Pt status and currently having heparin infusing at 1500 cc/ hr. Orders received for bronchodilators and chest x-ray. Will continue to monitor and assess.

## 2018-03-04 ENCOUNTER — Inpatient Hospital Stay (HOSPITAL_COMMUNITY): Payer: Medicaid Other

## 2018-03-04 LAB — BASIC METABOLIC PANEL
ANION GAP: 11 (ref 5–15)
BUN: 15 mg/dL (ref 6–20)
CALCIUM: 8.3 mg/dL — AB (ref 8.9–10.3)
CHLORIDE: 103 mmol/L (ref 101–111)
CO2: 20 mmol/L — AB (ref 22–32)
Creatinine, Ser: 0.76 mg/dL (ref 0.44–1.00)
GFR calc non Af Amer: >60 mL/min (ref >60)
Glucose, Bld: 116 mg/dL — ABNORMAL HIGH (ref 65–99)
Potassium: 3.7 mmol/L (ref 3.5–5.1)
Sodium: 134 mmol/L — ABNORMAL LOW (ref 135–145)

## 2018-03-04 LAB — CBC
HEMATOCRIT: 35.9 % — AB (ref 36.0–46.0)
HEMOGLOBIN: 11.9 g/dL — AB (ref 12.0–15.0)
MCH: 30.2 pg (ref 26.0–34.0)
MCHC: 33.1 g/dL (ref 30.0–36.0)
MCV: 91.1 fL (ref 78.0–100.0)
Platelets: 211 10*3/uL (ref 150–400)
RBC: 3.94 MIL/uL (ref 3.87–5.11)
RDW: 13.5 % (ref 11.5–15.5)
WBC: 11.6 10*3/uL — AB (ref 4.0–10.5)

## 2018-03-04 MED ORDER — ONDANSETRON 4 MG PO TBDP
4.0000 mg | ORAL_TABLET | Freq: Once | ORAL | Status: AC
  Administered 2018-03-04: 4 mg via ORAL
  Filled 2018-03-04: qty 1

## 2018-03-04 MED ORDER — LEVALBUTEROL HCL 0.63 MG/3ML IN NEBU
0.6300 mg | INHALATION_SOLUTION | Freq: Three times a day (TID) | RESPIRATORY_TRACT | Status: DC
  Filled 2018-03-04: qty 3

## 2018-03-04 MED ORDER — FUROSEMIDE 10 MG/ML IJ SOLN
20.0000 mg | Freq: Once | INTRAMUSCULAR | Status: DC
  Filled 2018-03-04: qty 2

## 2018-03-04 NOTE — Progress Notes (Signed)
    Called to the bedside as patient having increased work of breathing and BP dropped in the 80s. On exam has expiratory wheezing and crackles in the bases. Patient A&O x4, no chest pain. Will order Xopenex, lasix IV 20mg  x1, and CXR. No acute distress noted on exam. RN to monitor and call with update if not improving.   SignedLaverda Page, Benjamin Casanas, NP-C 03/04/2018, 5:09 PM Pager: [phone]

## 2018-03-04 NOTE — Care Management Note (Signed)
Case Management Note Donn PieriniKristi Armend Hochstatter RN, BSN Unit 4E-Case Manager-- 2H coverage [phone]  Patient Details  Name: Kelsey SchneidersBetty J Padilla MRN: [id number] Date of Birth: 1966/05/03  Subjective/Objective:  Pt admitted with STEMI, s/p DES and IABP                  Action/Plan: PTA Pt lived at home, independent- referral for Brilinta needs- IABP removed on 3/18- in to speak with pt on 3/19- pt slightly sleepy states she was just given a xanax- per discussion pt [facility]- [facility]- pt has transportation home, attempted to discuss Brilinta- pt does state to CM what Brilinta is for- pt provided 30 day free card and application for pt assistance placed on shadow chart for MD signature- pt will need f/u explanation for Brilinta card and application process prior to d/c due to pt being under the influence of xanax to be sure she understands what needs to be done. Spoke with bedside RN will make sure application goes with pt on transfer.   Expected Discharge Date:                  Expected Discharge Plan:  Home/Self Care  In-House Referral:     Discharge planning Services  CM Consult, Medication Assistance  Post Acute Care Choice:    Choice offered to:     DME Arranged:    DME Agency:     HH Arranged:    HH Agency:     Status of Service:  In process, will continue to follow  If discussed at Long Length of Stay Meetings, dates discussed:    Discharge Disposition:   Additional Comments:  Darrold SpanWebster, Sharniece Gibbon Hall, RN 03/04/2018, 4:25 PM

## 2018-03-04 NOTE — Progress Notes (Addendum)
Paged Jeraldine LootsHammond NP.... Needing hold parameters for lasix.... Pt's BP = 86/66  NP Jeraldine LootsHammond called back.... sts ok to hold lasix for now.

## 2018-03-04 NOTE — Progress Notes (Signed)
CARDIAC REHAB PHASE I   PRE:  Rate/Rhythm: 104 ST  BP:  Supine: 94/78  Sitting:   Standing:    SaO2: 92%RA  MODE:  Ambulation: 170 ft   POST:  Rate/Rhythm: 123 ST  BP:  Supine:   Sitting: 98/86  Standing:    SaO2: 94%RA 1320-1353 Pt walked 170 ft on RA with hand held asst x 2. Gait fairly steady. To recliner after walk. No c/o CP or dizziness. Call light in reach. Tolerated well for first walk. Can be x1 with walker.   Luetta Nuttingharlene Carlyle Achenbach, RN BSN  03/04/2018 1:50 PM

## 2018-03-04 NOTE — Progress Notes (Signed)
[facility], will transfer patient and all belongings. 6E to assume care.  Herma ArdMOSELEY, Damyra Luscher F, RN

## 2018-03-04 NOTE — Progress Notes (Signed)
Attempted report, RN Judeth CornfieldStephanie to call this RN back. Will continue to monitor.  Herma ArdMOSELEY, Rendon Howell F, RN

## 2018-03-04 NOTE — Progress Notes (Signed)
Progress Note  Patient Name: Kelsey Padilla Date of Encounter: 03/04/2018  Primary Cardiologist: No primary care provider on file.   Subjective   Patient sitting up in bed today for entering the room.  She stated that despite the nausea she felt significantly improved.  The patient continues to refrain from eating she states that she simply does not feel like it.  She denies chest pain, back pain, abdominal pain, or dyspnea.  Inpatient Medications    Scheduled Meds: . aspirin  81 mg Oral Daily  . atorvastatin  80 mg Oral q1800  . azithromycin  250 mg Oral Daily  . enoxaparin (LOVENOX) injection  40 mg Subcutaneous QHS  . levalbuterol  0.63 mg Nebulization Q6H  . mouth rinse  15 mL Mouth Rinse BID  . metoprolol tartrate  25 mg Oral BID  . sodium chloride flush  3 mL Intravenous Q12H  . ticagrelor  90 mg Oral BID   Continuous Infusions: . sodium chloride Stopped (03/03/18 1800)  . sodium chloride Stopped (03/03/18 1800)  . cefTRIAXone (ROCEPHIN)  IV Stopped (03/03/18 0953)   PRN Meds: sodium chloride, acetaminophen, ALPRAZolam, guaiFENesin-dextromethorphan, nitroGLYCERIN, ondansetron (ZOFRAN) IV, senna-docusate, sodium chloride flush   Vital Signs    Vitals:   03/04/18 0343 03/04/18 0400 03/04/18 0500 03/04/18 0600  BP:  92/80 103/60 101/67  Pulse:      Resp:  (!) 45 (!) 44 (!) 38  Temp: 97.9 F (36.6 C)     TempSrc: Oral     SpO2:  97% 97% 95%  Weight:      Height:        Intake/Output Summary (Last 24 hours) at 03/04/2018 0648 Last data filed at 03/04/2018 0000 Gross per 24 hour  Intake 611.07 ml  Output 1200 ml  Net -588.93 ml   Filed Weights   02/28/18 0246 02/28/18 0545  Weight: 72.6 kg (160 lb) 72.6 kg (160 lb 0.9 oz)    Telemetry    Normal sinus rhythm- Personally Reviewed  ECG    Most recent ECG performed on 3/15 demonstrating continued ST elevation in leads II, III, aVF with reciprocal T wave inversion in V2 through V6 - Personally  Reviewed  Physical Exam   GEN: No acute distress.  Resting comfortably in her bed patient continues to be alert and oriented x3.  Splint has been removed Neck: No JVD Cardiac: RRR, no murmurs, rubs, or gallops.  Respiratory: Clear to auscultation bilaterally. GI: Soft, nontender, non-distended  MS: No edema; No deformity.  Catheter site is clean and clear and free of hematoma Neuro:  Nonfocal  Psych: Normal affect   Labs    Chemistry Recent Labs  Lab 02/28/18 0247  03/02/18 0315 03/03/18 0341 03/04/18 0223  NA 137   < > 136 134* 134*  K 3.9   < > 3.6 3.4* 3.7  CL 104   < > 107 106 103  CO2 18*   < > 21* 20* 20*  GLUCOSE 183*   < > 152* 176* 116*  BUN 12   < > 22* 16 15  CREATININE 0.96   < > 0.82 0.74 0.76  CALCIUM 8.8*   < > 8.5* 8.1* 8.3*  PROT 6.1*  --   --   --   --   ALBUMIN 3.3*  --   --   --   --   AST 33  --   --   --   --   ALT 30  --   --   --   --  ALKPHOS 68  --   --   --   --   BILITOT 0.5  --   --   --   --   GFRNONAA >60   < > >60 >60 >60  GFRAA >60   < > >60 >60 >60  ANIONGAP 15   < > 8 8 11    < > = values in this interval not displayed.     Hematology Recent Labs  Lab 03/02/18 0315 03/03/18 0341 03/04/18 0223  WBC 16.7* 16.4* 11.6*  RBC 4.22 4.03 3.94  HGB 12.7 12.2 11.9*  HCT 38.0 36.9 35.9*  MCV 90.0 91.6 91.1  MCH 30.1 30.3 30.2  MCHC 33.4 33.1 33.1  RDW 13.4 13.4 13.5  PLT 179 189 211    Cardiac Enzymes Recent Labs  Lab 02/28/18 1726 03/01/18 0032 03/02/18 0315 03/03/18 1302  TROPONINI 47.17* >65.00* >65.00* 22.35*    Recent Labs  Lab 02/28/18 0254  TROPIPOC 0.02     BNPNo results for input(s): BNP, PROBNP in the last 168 hours.   DDimer No results for input(s): DDIMER in the last 168 hours.   Radiology    Dg Chest Port 1 View  Result Date: 03/03/2018 CLINICAL DATA:  Acute dyspnea EXAM: PORTABLE CHEST 1 VIEW COMPARISON:  03/01/2018 FINDINGS: Interval progression of confluent airspace opacities throughout both  lungs either representing worsening CHF or progression of multifocal pneumonia. Stable cardiomegaly partially obscured by adjacent airspace disease. Patient is status post median sternotomy. Swan-Ganz catheter is noted the left pulmonary artery. No acute osseous abnormalities visualized. IMPRESSION: Worsening of bilateral airspace opacities in setting cardiomegaly. Findings may represent progression of CHF or multifocal, multilobar pneumonia. Electronically Signed   By: Tollie Ethavid  Kwon M.D.   On: 03/03/2018 03:51    Cardiac Studies   Cardiac cath on 02-28-2018:  Prox RCA to Mid RCA lesion is 75% stenosed.  Dist RCA lesion is 100% stenosed. SVG to PDA is occluded.  Mid LM to Ost LAD lesion is 75% stenosed.  Prox LAD to Mid LAD lesion is 100% stenosed. LIMA to LAD.  Origin to Prox Graft lesion is 40% stenosed.  Ost 2nd Mrg lesion is 100% stenosed. SVG to OM Dist Graft to Insertion lesion is 100% stenosed. This is the culprit.  A drug-eluting stent was successfully placed using a STENT SYNERGY DES 3.5X38.  Post intervention, there is a 0% residual stenosis. However, microvascular occlusion will cause ongoing injury.  The left ventricular ejection fraction is 25-35% by visual estimate.  There is moderate to severe left ventricular systolic dysfunction.  LV end diastolic pressure is moderately elevated.  There is no aortic valve stenosis.  Ao sat 98%, PA sat 61%, PA mean 18 mm Hg; PCWP 17 mm Hg  IABP placed  Decreased EF increased LVEDP. BP stable. IABP placed given ongoing ischemia due to no reflow.  DAPT for 1 year. She will need aggressive secondary prevention and CHF management.  Echocardiogram on 03/03/2018: Study Conclusions - Left ventricle: The cavity size was normal. Systolic function was mildly reduced. The estimated ejection fraction was in the range of 45% to 50%. Hypokinesis of the inferior myocardium. - Mitral valve: There was mild regurgitation.  Patient  Profile     52 y.o. female with CAD status post CABG 2012 with a LIMA to LAD, SVG to OM and SVG to RPDA, hyperlipidemia, continued tobacco abuse who presented to the ED with atypical STEMI.  She underwent cardiac catheterization which noted a patent LIMA, the complete occlusion  of both SVGs.  There was TIMI grade 0 flow after stent placement to SVG to the RCA.  Medical management was recommended.  IABP was placed given the ongoing ischemia due to no reflow.  Assessment & Plan    CAD s/p CABG: STEMI: Hyperlipidemia: Initially presented with with an anterolateral STEMI and consequently underwent cardiac cath with PCI to SVG to the RCA.  There is grade 0 TIMI flow following stent placement due to downstream clot occlusion.  Troponin peaked at 65 and felt today to 22.35.  Okay to discontinue trend.  May need to consider PCS canine level of statin if she feels tolerance as she has in the past. -Continue high-dose atorvastatin 80 mg daily -Continue metoprolol 25 mg twice daily with holding parameters for systolic less than 95 -Continue Brilinta and ASA -Heparin discontinued -Lovenox 40 mg for VTE PPX -IAPB catheter removed.  Patient's blood pressure continues to be soft following the removal of the IAPB but otherwise stable and asymptomatic  Acute systolic and diastolic heart failure: LVEF 25% by LV gram however echocardiogram indicating 45-50% with hypokinesis of the inferior myocardium.  Patient's CXR concerning for poor progression of CHF versus multifocal/multilobar pneumonia.  He has been started on antibiotics due to concern for possible CAP.  She has slightly positive fluid status at 755 cc since admission.  We will continue to monitor her output will hold diuresis given her soft blood pressure at this time. -Continue to monitor I's and O's daily  Hypokalemia: Patient was treated with IV potassium the previous night.  Her hypokalemia appears to have resolved.  Current potassium 3.7. -Repeat  BMP tomorrow morning  Possible CAP: W BC is elevated with productive cough for a week.  CXR concerning for infiltrate versus possible pneumonia versus pulmonary edema. -Okay to continue ceftriaxone as azithromycin -Respiratory panel negative -Blood cultures negative times 3 days -Continue guaifenesin-dextromethorphan every 4 hours as needed -Continue CBC in a.m.  The patient appears to be in stable condition enabling her to be transferred to a telemetry bed.  Blood pressure is improved slightly with ambulation and increased p.o. intake.  We will continue to monitor  For questions or updates, please contact [facility] Please consult [email or web address] for contact info under Cardiology/STEMI.   Please see attending note/attestation for current assessment and plan.    Signed, Lanelle Bal, MD  03/04/2018, 6:48 AM   Pager [phone]

## 2018-03-04 NOTE — Plan of Care (Signed)
  Progressing Health Behavior/Discharge Planning: Ability to manage health-related needs will improve 03/03/2018 2357 - Progressing by Jasper RilingSarine, Elainna Eshleman M, RN Clinical Measurements: Ability to maintain clinical measurements within normal limits will improve 03/03/2018 2357 - Progressing by Jasper RilingSarine, Kalene Cutler M, RN Will remain free from infection 03/03/2018 2357 - Progressing by Jasper RilingSarine, Heinrich Fertig M, RN Diagnostic test results will improve 03/03/2018 2357 - Progressing by Jasper RilingSarine, Sherrin Stahle M, RN Respiratory complications will improve 03/03/2018 2357 - Progressing by Jasper RilingSarine, Domenique Southers M, RN Cardiovascular complication will be avoided 03/03/2018 2357 - Progressing by Jasper RilingSarine, Maxamilian Amadon M, RN Activity: Risk for activity intolerance will decrease 03/03/2018 2357 - Progressing by Jasper RilingSarine, Eugene Zeiders M, RN Note Pt has tolerated ambulation in room and to and from bathroom since IABP removed and off bedrest Nutrition: Adequate nutrition will be maintained 03/03/2018 2357 - Progressing by Jasper RilingSarine, Makinlee Awwad M, RN Coping: Level of anxiety will decrease 03/03/2018 2357 - Progressing by Jasper RilingSarine, Ermel Verne M, RN Note Pt describes feeling anxious, especially at night time in the hospital, support offered Elimination: Will not experience complications related to bowel motility 03/03/2018 2357 - Progressing by Jasper RilingSarine, Dai Mcadams M, RN Note Pt with soft BMs this evening, describes upset stomach, thinks it is due to anxiety Will not experience complications related to urinary retention 03/03/2018 2357 - Progressing by Jasper RilingSarine, Yvett Rossel M, RN Note Foley removed today, no issues or problems urinating Safety: Ability to remain free from injury will improve 03/03/2018 2357 - Progressing by Jasper RilingSarine, Destanee Bedonie M, RN Skin Integrity: Risk for impaired skin integrity will decrease 03/03/2018 2357 - Progressing by Jasper RilingSarine, Mozella Rexrode M, RN Education: Understanding of CV disease, CV risk reduction, and recovery process will improve 03/03/2018 2357 -  Progressing by Jasper RilingSarine, Lenae Wherley M, RN Activity: Ability to return to baseline activity level will improve 03/03/2018 2357 - Progressing by Jasper RilingSarine, Ahana Najera M, RN Cardiovascular: Ability to achieve and maintain adequate cardiovascular perfusion will improve 03/03/2018 2357 - Progressing by Jasper RilingSarine, Aster Eckrich M, RN Note Pt with soft BPs since late today, otherwise stable, MD aware Health Behavior/Discharge Planning: Ability to safely manage health-related needs after discharge will improve 03/03/2018 2357 - Progressing by Jasper RilingSarine, Saria Haran M, RN Education: Understanding of cardiac disease, CV risk reduction, and recovery process will improve 03/03/2018 2357 - Progressing by Jasper RilingSarine, Levin Dagostino M, RN Understanding of medication regimen will improve 03/03/2018 2357 - Progressing by Jasper RilingSarine, Kensli Bowley M, RN Activity: Ability to tolerate increased activity will improve 03/03/2018 2357 - Progressing by Jasper RilingSarine, Chaston Bradburn M, RN Cardiac: Ability to achieve and maintain adequate cardiopulmonary perfusion will improve 03/03/2018 2357 - Progressing by Jasper RilingSarine, Abriel Hattery M, RN Health Behavior/Discharge Planning: Ability to safely manage health-related needs after discharge will improve 03/03/2018 2357 - Progressing by Jasper RilingSarine, Shadrack Brummitt M, RN

## 2018-03-05 LAB — CBC
HCT: 34.4 % — ABNORMAL LOW (ref 36.0–46.0)
HEMOGLOBIN: 11.5 g/dL — AB (ref 12.0–15.0)
MCH: 30.3 pg (ref 26.0–34.0)
MCHC: 33.4 g/dL (ref 30.0–36.0)
MCV: 90.5 fL (ref 78.0–100.0)
PLATELETS: 262 10*3/uL (ref 150–400)
RBC: 3.8 MIL/uL — ABNORMAL LOW (ref 3.87–5.11)
RDW: 13.3 % (ref 11.5–15.5)
WBC: 9.4 10*3/uL (ref 4.0–10.5)

## 2018-03-05 LAB — BASIC METABOLIC PANEL
Anion gap: 12 (ref 5–15)
BUN: 12 mg/dL (ref 6–20)
CALCIUM: 8.3 mg/dL — AB (ref 8.9–10.3)
CHLORIDE: 106 mmol/L (ref 101–111)
CO2: 19 mmol/L — ABNORMAL LOW (ref 22–32)
CREATININE: 0.73 mg/dL (ref 0.44–1.00)
Glucose, Bld: 104 mg/dL — ABNORMAL HIGH (ref 65–99)
Potassium: 3.5 mmol/L (ref 3.5–5.1)
SODIUM: 137 mmol/L (ref 135–145)

## 2018-03-05 MED ORDER — EZETIMIBE 10 MG PO TABS
10.0000 mg | ORAL_TABLET | Freq: Every day | ORAL | Status: DC
  Administered 2018-03-05 – 2018-03-06 (×2): 10 mg via ORAL
  Filled 2018-03-05 (×2): qty 1

## 2018-03-05 MED ORDER — POTASSIUM CHLORIDE CRYS ER 20 MEQ PO TBCR
40.0000 meq | EXTENDED_RELEASE_TABLET | Freq: Once | ORAL | Status: AC
  Administered 2018-03-05: 40 meq via ORAL
  Filled 2018-03-05: qty 2

## 2018-03-05 MED ORDER — SIMVASTATIN 40 MG PO TABS
40.0000 mg | ORAL_TABLET | Freq: Every day | ORAL | Status: DC
  Administered 2018-03-05: 40 mg via ORAL
  Filled 2018-03-05: qty 1

## 2018-03-05 MED ORDER — LEVALBUTEROL HCL 0.63 MG/3ML IN NEBU: 0.6300 mg | INHALATION_SOLUTION | Freq: Four times a day (QID) | RESPIRATORY_TRACT | Status: DC | PRN

## 2018-03-05 NOTE — Progress Notes (Signed)
CARDIAC REHAB PHASE I   PRE:  Rate/Rhythm: 87 SR  BP:  Supine: 76/60  Sitting: 91/74  Standing:    SaO2: 95%RA  MODE:  Ambulation: 350 ft   POST:  Rate/Rhythm: 97SR  BP:  Supine:   Sitting: 97/64  Standing:    SaO2: 94%RA 1120-1215 Pt walked 350 ft with hand held asst with steady gait and tolerated well. No dizziness even with low BP. MI education completed with pt and husband who voiced understanding. Stressed importance of brilinta with stent. Has seen case Production designer, theatre/television/filmmanager. Reviewed NTG use, MI restrictions, heart healthy food choices, ex ed ,tobacco cessation and CRP 2. Referring to [place] Phase 2. Discussed not smoking and she stated going to quit cold [place]. Husband wanted fake cigarette as he is going to try to quit also.   Kelsey Nuttingharlene Fleta Borgeson, RN BSN  03/05/2018 12:11 PM

## 2018-03-05 NOTE — Progress Notes (Signed)
Progress Note  Patient Name: Kelsey Padilla Date of Encounter: 03/05/2018  Primary Cardiologist: Previously Dr. Antoine Poche; without insurance at this time.  Subjective   Patient feeling better this AM. Breathing more comfortably compared to yesterday. Still with dry cough. Denies chest pain or palpitations. Currently uninsured and worried about follow-up care.   Inpatient Medications    Scheduled Meds: . aspirin  81 mg Oral Daily  . atorvastatin  80 mg Oral q1800  . enoxaparin (LOVENOX) injection  40 mg Subcutaneous QHS  . furosemide  20 mg Intravenous Once  . levalbuterol  0.63 mg Nebulization TID  . mouth rinse  15 mL Mouth Rinse BID  . metoprolol tartrate  25 mg Oral BID  . potassium chloride  40 mEq Oral Once  . sodium chloride flush  3 mL Intravenous Q12H  . ticagrelor  90 mg Oral BID   Continuous Infusions: . sodium chloride Stopped (03/03/18 1800)  . sodium chloride Stopped (03/03/18 1800)  . cefTRIAXone (ROCEPHIN)  IV Stopped (03/05/18 1009)   PRN Meds: sodium chloride, acetaminophen, ALPRAZolam, guaiFENesin-dextromethorphan, nitroGLYCERIN, senna-docusate, sodium chloride flush   Vital Signs    Vitals:   03/04/18 2030 03/04/18 2040 03/05/18 0010 03/05/18 0527  BP: (!) 84/69  94/69 100/66  Pulse: 93  99   Resp: (!) 34   16  Temp:   98 F (36.7 C) 98.3 F (36.8 C)  TempSrc:   Oral Oral  SpO2: 95% 96% 96% 92%  Weight:    157 lb 4.8 oz (71.4 kg)  Height:        Intake/Output Summary (Last 24 hours) at 03/05/2018 1011 Last data filed at 03/04/2018 2200 Gross per 24 hour  Intake 340 ml  Output 300 ml  Net 40 ml   Filed Weights   02/28/18 0246 02/28/18 0545 03/05/18 0527  Weight: 160 lb (72.6 kg) 160 lb 0.9 oz (72.6 kg) 157 lb 4.8 oz (71.4 kg)    Telemetry    NSR and sinus tachycardia - Personally Reviewed  Physical Exam   GEN: Laying in bed in no acute distress. Breathing more comfortably compared to examination yesterday evening.  Neck: No JVD, no  carotid bruits Cardiac: RRR, no murmurs, rubs, or gallops.  Respiratory: Clear to auscultation bilaterally, no wheezes/ rales/ rhonchi; normal work of breathing GI: NABS, Soft, nontender, non-distended  MS: No edema; No deformity. Neuro:  Nonfocal, moving all extremities spontaneously Psych: Normal affect   Labs    Chemistry Recent Labs  Lab 02/28/18 0247  03/03/18 0341 03/04/18 0223 03/05/18 0322  NA 137   < > 134* 134* 137  K 3.9   < > 3.4* 3.7 3.5  CL 104   < > 106 103 106  CO2 18*   < > 20* 20* 19*  GLUCOSE 183*   < > 176* 116* 104*  BUN 12   < > 16 15 12   CREATININE 0.96   < > 0.74 0.76 0.73  CALCIUM 8.8*   < > 8.1* 8.3* 8.3*  PROT 6.1*  --   --   --   --   ALBUMIN 3.3*  --   --   --   --   AST 33  --   --   --   --   ALT 30  --   --   --   --   ALKPHOS 68  --   --   --   --   BILITOT 0.5  --   --   --   --  GFRNONAA >60   < > >60 >60 >60  GFRAA >60   < > >60 >60 >60  ANIONGAP 15   < > 8 11 12    < > = values in this interval not displayed.     Hematology Recent Labs  Lab 03/03/18 0341 03/04/18 0223 03/05/18 0322  WBC 16.4* 11.6* 9.4  RBC 4.03 3.94 3.80*  HGB 12.2 11.9* 11.5*  HCT 36.9 35.9* 34.4*  MCV 91.6 91.1 90.5  MCH 30.3 30.2 30.3  MCHC 33.1 33.1 33.4  RDW 13.4 13.5 13.3  PLT 189 211 262    Cardiac Enzymes Recent Labs  Lab 02/28/18 1726 03/01/18 0032 03/02/18 0315 03/03/18 1302  TROPONINI 47.17* >65.00* >65.00* 22.35*    Recent Labs  Lab 02/28/18 0254  TROPIPOC 0.02     BNPNo results for input(s): BNP, PROBNP in the last 168 hours.   DDimer No results for input(s): DDIMER in the last 168 hours.   Radiology    Dg Chest Port 1 View  Result Date: 03/04/2018 CLINICAL DATA:  Decreased oxygen saturation, recent heart attack, history coronary artery disease post stenting, smoker EXAM: PORTABLE CHEST 1 VIEW COMPARISON:  Portable exam 1711 hours compared to 03/03/2018 FINDINGS: Interval removal of Swan-Ganz catheter via infra  diaphragmatic approach. Enlargement of cardiac silhouette post CABG. Slight pulmonary vascular congestion. Mediastinal contour stable. Diffuse pulmonary infiltrates question pulmonary edema versus infection, slightly improved. No gross pleural effusion or pneumothorax. IMPRESSION: Slightly improved BILATERAL pulmonary infiltrates. Electronically Signed   By: Ulyses Southward M.D.   On: 03/04/2018 17:34    Cardiac Studies   Echocardiogram 03/02/18: Study Conclusions  - Left ventricle: The cavity size was normal. Systolic function was   mildly reduced. The estimated ejection fraction was in the range   of 45% to 50%. Hypokinesis of the inferior myocardium. - Mitral valve: There was mild regurgitation.  Left heart catheterization 02/28/18: Conclusion    Prox RCA to Mid RCA lesion is 75% stenosed.  Dist RCA lesion is 100% stenosed. SVG to PDA is occluded.  Mid LM to Ost LAD lesion is 75% stenosed.  Prox LAD to Mid LAD lesion is 100% stenosed. LIMA to LAD.  Origin to Prox Graft lesion is 40% stenosed.  Ost 2nd Mrg lesion is 100% stenosed. SVG to OM Dist Graft to Insertion lesion is 100% stenosed. This is the culprit.  A drug-eluting stent was successfully placed using a STENT SYNERGY DES 3.5X38.  Post intervention, there is a 0% residual stenosis. However, microvascular occlusion will cause ongoing injury.  The left ventricular ejection fraction is 25-35% by visual estimate.  There is moderate to severe left ventricular systolic dysfunction.  LV end diastolic pressure is moderately elevated.  There is no aortic valve stenosis.  Ao sat 98%, PA sat 61%, PA mean 18 mm Hg; PCWP 17 mm Hg  IABP placed   Decreased EF increased LVEDP.  BP stable.  IABP placed given ongoing ischemia due to no reflow.    DAPT for 1 year.  She will need aggressive secondary prevention and CHF management.      Patient Profile     52 y.o. female with CAD status post CABG 2012 with a LIMA to LAD, SVG  to OM and SVG to RPDA, hyperlipidemia, continued tobacco abuse who presented to the ED with atypical STEMI. She underwent cardiac catheterization which noted a patent LIMA, the complete occlusion of both SVGs. There was TIMI grade 0 flow after stent placement to SVG to the RCA.  Medical management was recommended. IABP was placed given the ongoing ischemia due to no reflow.  Assessment & Plan    1. STEMI in pt with CAD s/p CABG: p/w anterolateral STEMI. LHC with PCI to RCA with 0 TIMI flow following stent placement due to downstream clot occlusion. Recommended for medical management. IABP placed given ongoing ischemia - removed 3/18. Troponin peaked at 65.  - Continue ASA, brilinta, statin, and metoprolol  2. Acute combined CHF: Echo with EF 45-50% with hypokinesis of the inferior myocardium. CXR with diffuse pulmonary infiltrates - edema vs infection. S/p IV lasix 40mg  3/18. With +85895mL this admission.  - Continue metoprolol with hold parameters - Continue to monitor strict I&Os, daily weights - Limited in ability to add ACEi given hypotension  3. Community acquired pneumonia: CXR with edema vs infection. History of productive cough x1 week. Leukocytosis to 16.7 on 3/17, now improved to 9.4 today. RVP negative. S/p 5d course of azithromycin. Today is day 5 of ceftriaxone. - Can likely d/c ceftriaxone today given patient is afebrile and symptoms improved.  - Continue supportive care with cough suppressants/expectorants  4. Hypokalemia: K 3.5 today - Will supplement 40mEq po potassium today  5. HLD: LDL 221; goal <70. Documented intolerance to statin in outpatient notes - Consider referral to lipid clinic at discharge for consideration of PSK9 inhibitor - Continue atorvastatin - tolerating at this time.   Anticipate discharge today or tomorrow. Will ask case management to see for assistance with insurance coverage options. Will arrange outpatient follow-up.   For questions or updates,  please contact [facility] Please consult [email or web address] for contact info under Cardiology/STEMI.      Signed, Beatriz StallionKrista M. Kadin Canipe, PA-C  03/05/2018, 10:11 AM   [phone]

## 2018-03-05 NOTE — Care Management Note (Signed)
Case Management Note  Patient Details  Name: Kelsey Padilla MRN: [id number] Date of Birth: [birth year]  Subjective/Objective:  Pt presented for Stemi- S/P cardiac cath with stent placement. PTA from home with husband that gets disability. Pt states she is not working at this time and she does not have insurance.              Action/Plan: CM did schedule a hospital follow up with [facility]-Once patient has the appointment she will be able to get some of her medications at the [facility]. Once stable pt will initially use [facility] for medications. CM did discuss Lipitor for patient- pt states she didn't realize she was taking the medication because it causes her leg weakness. CM did make pharmacy aware. Pt has 30 day free Brilinta Card. No further needs from CM at this time.   Expected Discharge Date:                  Expected Discharge Plan:  Home/Self Care  In-House Referral:  NA  Discharge planning Services  CM Consult, Medication Assistance, Follow-up appt scheduled, [facility]  Post Acute Care Choice:  NA Choice offered to:  NA  DME Arranged:  N/A DME Agency:  NA  HH Arranged:  NA HH Agency:  NA  Status of Service:  Completed, signed off  If discussed at Long Length of Stay Meetings, dates discussed:    Additional Comments:  Kelsey Padilla, Kelsey [place] Kaye, RN 03/05/2018, 2:58 PM

## 2018-03-05 NOTE — Discharge Summary (Addendum)
Discharge Summary    Patient ID: Kelsey Padilla,  MRN: [id number], DOB/AGE: [birth year] 52 y.o.  Admit date: 02/28/2018 Discharge date: 03/06/2018  Primary Care Provider: No primary care provider on file. Primary Cardiologist: [facility] Ace)  Discharge Diagnoses    Active Problems:   STEMI (ST elevation myocardial infarction) (HCC)   Hyperlipidemia   Tobacco abuse   Acute MI, true posterior wall, initial episode of care [facility])   Community acquired pneumonia   Allergies Allergies  Allergen Reactions  . Codeine Nausea And Vomiting  . Statins     Patient says cannot tolerate statins d/t muscle cramping.  She is taking Simvastatin 40mg  PTA, reports she can tolerate simvastatin better.     Diagnostic Studies/Procedures    Echocardiogram 03/02/18: Study Conclusions  - Left ventricle: The cavity size was normal. Systolic function was mildly reduced. The estimated ejection fraction was in the range of 45% to 50%. Hypokinesis of the inferior myocardium. - Mitral valve: There was mild regurgitation.  Left heart catheterization 02/28/18: Conclusion    Prox RCA to Mid RCA lesion is 75% stenosed.  Dist RCA lesion is 100% stenosed. SVG to PDA is occluded.  Mid LM to Ost LAD lesion is 75% stenosed.  Prox LAD to Mid LAD lesion is 100% stenosed. LIMA to LAD.  Origin to Prox Graft lesion is 40% stenosed.  Ost 2nd Mrg lesion is 100% stenosed. SVG to OM Dist Graft to Insertion lesion is 100% stenosed. This is the culprit.  A drug-eluting stent was successfully placed using a STENT SYNERGY DES 3.5X38.  Post intervention, there is a 0% residual stenosis. However, microvascular occlusion will cause ongoing injury.  The left ventricular ejection fraction is 25-35% by visual estimate.  There is moderate to severe left ventricular systolic dysfunction.  LV end diastolic pressure is moderately elevated.  There is no aortic valve stenosis.  Ao sat 98%, PA sat 61%, PA  mean 18 mm Hg; PCWP 17 mm Hg  IABP placed  Decreased EF increased LVEDP. BP stable. IABP placed given ongoing ischemia due to no reflow.   DAPT for 1 year.  She will need aggressive secondary prevention and CHF management.    _____________   History of Present Illness     Kelsey Padilla is a 52 y.o. female with history of CAD s/p CABg (2012, LIMA to LAD, SVG to OM, SVG to RPDA), who presents with acute chest pain, found to have inferior/posterior STE.  Patient woke up with acute chest pain, associated with SOB, and significant diaphoresis, approximately 1.5 hours prior to presentation.  She took a full dose ASA and called EMS.  Upon EMS arrival her initial BP was 77/46.  She was given IVF bolus with improvement in BPs.  Initial 12 lead showed STE in III, aVF and profound STD in V1-V3.  A code STEMI was called, and the patient was brought to the [facility] ED for further evaluation.   Hospital Course     Consultants: None   1. STEMI in pt with CAD s/p CABG: p/w anterolateral STEMI. LHC with PCI to RCA with 0 TIMI flow following stent placement due to downstream clot occlusion. Recommended for medical management. IABP placed given ongoing ischemia - removed 3/18. Troponin peaked at 65.  - Continue ASA and brilinta for 1 year - Continue statin and metoprolol  2. Acute combined CHF: Echo with EF 45-50% with hypokinesis of the inferior myocardium. CXR with diffuse pulmonary infiltrates - edema vs infection. S/p  IV lasix 40mg  3/18. Overall euvolemic on exam - Discharged home on metoprolol XL daily - Consider addition of ACEi outpatient if hypotension improves  3. Community acquired pneumonia: CXR with edema vs infection. History of productive cough x1 week. Leukocytosis to 16.7 on 3/17, now improved to 7.6 at discharge. RVP negative. Completed a 5 day course of azithromycin and ceftriaxone. SOB and wheezing managed with nebulizers. - Continue OTC supportive care with cough  suppressants/expectorants   4. Hypokalemia: K 3.4 on discharge with supplement given prior to discharge.   5. HLD: LDL 221; goal <70. Documented intolerance to atorvastatin and rosuvastatin in outpatient notes. Was on simvastatin outpatient. - Continue simvastatin - Started zetia for better LDL control - Would benefit from referral to lipid clinic for consideration of PSK9 inhibitor. Unfortunately patient is without insurance and is unlikely to be able to afford these medications.    General: Well developed, well nourished, female appearing in no acute distress. Head: Normocephalic, atraumatic.  Neck: Supple without bruits, JVD. Lungs:  Resp regular and unlabored, CTA. Heart: RRR, S1, S2, no S3, S4, or murmur; no rub. Abdomen: Soft, non-tender, non-distended with normoactive bowel sounds. No hepatomegaly. No rebound/guarding. No obvious abdominal masses. Extremities: No clubbing, cyanosis, edema. Distal pedal pulses are 2+ bilaterally. R femoral cath site stable without bruising or hematoma Neuro: Alert and oriented X 3. Moves all extremities spontaneously. Psych: Normal affect.  _____________  Discharge Vitals Blood pressure 104/71, pulse 82, temperature 97.6 F (36.4 C), temperature source Oral, resp. rate 16, height 5\' 3"  (1.6 m), weight 157 lb 10.1 oz (71.5 kg), SpO2 96 %.  Filed Weights   02/28/18 0545 03/05/18 0527 03/06/18 0511  Weight: 160 lb 0.9 oz (72.6 kg) 157 lb 4.8 oz (71.4 kg) 157 lb 10.1 oz (71.5 kg)    Labs & Radiologic Studies    CBC Recent Labs    03/05/18 0322 03/06/18 0453  WBC 9.4 7.6  HGB 11.5* 11.2*  HCT 34.4* 33.6*  MCV 90.5 89.1  PLT 262 291   Basic Metabolic Panel Recent Labs    16/10/96 0322 03/06/18 0453  NA 137 136  K 3.5 3.4*  CL 106 107  CO2 19* 20*  GLUCOSE 104* 117*  BUN 12 14  CREATININE 0.73 0.75  CALCIUM 8.3* 8.4*   Liver Function Tests No results for input(s): AST, ALT, ALKPHOS, BILITOT, PROT, ALBUMIN in the last 72  hours. No results for input(s): LIPASE, AMYLASE in the last 72 hours. Cardiac Enzymes Recent Labs    03/03/18 1302  TROPONINI 22.35*   BNP Invalid input(s): POCBNP D-Dimer No results for input(s): DDIMER in the last 72 hours. Hemoglobin A1C No results for input(s): HGBA1C in the last 72 hours. Fasting Lipid Panel No results for input(s): CHOL, HDL, LDLCALC, TRIG, CHOLHDL, LDLDIRECT in the last 72 hours. Thyroid Function Tests No results for input(s): TSH, T4TOTAL, T3FREE, THYROIDAB in the last 72 hours.  Invalid input(s): FREET3 _____________  Dg Chest Port 1 View  Result Date: 03/04/2018 CLINICAL DATA:  Decreased oxygen saturation, recent heart attack, history coronary artery disease post stenting, smoker EXAM: PORTABLE CHEST 1 VIEW COMPARISON:  Portable exam 1711 hours compared to 03/03/2018 FINDINGS: Interval removal of Swan-Ganz catheter via infra diaphragmatic approach. Enlargement of cardiac silhouette post CABG. Slight pulmonary vascular congestion. Mediastinal contour stable. Diffuse pulmonary infiltrates question pulmonary edema versus infection, slightly improved. No gross pleural effusion or pneumothorax. IMPRESSION: Slightly improved BILATERAL pulmonary infiltrates. Electronically Signed   By: Angelyn Punt.D.  On: 03/04/2018 17:34   Dg Chest Port 1 View  Result Date: 03/03/2018 CLINICAL DATA:  Acute dyspnea EXAM: PORTABLE CHEST 1 VIEW COMPARISON:  03/01/2018 FINDINGS: Interval progression of confluent airspace opacities throughout both lungs either representing worsening CHF or progression of multifocal pneumonia. Stable cardiomegaly partially obscured by adjacent airspace disease. Patient is status post median sternotomy. Swan-Ganz catheter is noted the left pulmonary artery. No acute osseous abnormalities visualized. IMPRESSION: Worsening of bilateral airspace opacities in setting cardiomegaly. Findings may represent progression of CHF or multifocal, multilobar pneumonia.  Electronically Signed   By: Tollie Eth M.D.   On: 03/03/2018 03:51   Dg Chest Port 1 View  Result Date: 03/01/2018 CLINICAL DATA:  Intra-aortic balloon pump. EXAM: PORTABLE CHEST 1 VIEW COMPARISON:  02/28/2018. FINDINGS: The heart is enlarged. Unchanged position of a IABP. Increasing BILATERAL pulmonary opacities suggest developing edema or pneumonia. These are worse on the RIGHT. No effusion or volume loss. Swan-Ganz catheter tip LEFT pulmonary artery. IMPRESSION: Worsening aeration. Increasing BILATERAL pulmonary opacities, greater on the RIGHT. This could represent pulmonary edema or BILATERAL pneumonia. Electronically Signed   By: Elsie Stain M.D.   On: 03/01/2018 07:23   Dg Chest Portable 1 View  Result Date: 02/28/2018 CLINICAL DATA:  Non ST-elevation myocardial infarction, acute onset. EXAM: PORTABLE CHEST 1 VIEW COMPARISON:  Chest radiograph performed 09/11/2011 FINDINGS: The lungs are well-aerated. Pulmonary vascularity is at the upper limits of normal. Mild retrocardiac opacity may reflect atelectasis. There is no evidence of pleural effusion or pneumothorax. The cardiomediastinal silhouette is normal in size. The patient is status post median sternotomy. No acute osseous abnormalities are seen. IMPRESSION: Mild retrocardiac opacity may reflect atelectasis. Lungs otherwise clear. Electronically Signed   By: Roanna Raider M.D.   On: 02/28/2018 06:43   Disposition   Patient was seen and examined by Dr. [place] who deemed patient as stable for discharge. Follow-up has been arranged. Discharge medications as listed below.   Follow-up Plans & Appointments    Follow-up Information    [facility]. Go on 03/17/2018.   Why:  @ 2:00 pm for hospital follow up with Kizzie Furnish NP- Please be 15 min earlier and bring proof of Income and photo ID with a list of medicaitons from Hospital.  Pt will need to speak with Marylu Lund for Patient Assistance for Brilinta.  Contact  information: [address] [place] [address] [phone]        Dyann Kief, PA-C Follow up on 03/24/2018.   Specialty:  Cardiology Why:  12:30pm for your follow up appt.  Contact information: [address] [place] [address] [phone]          Discharge Instructions    Amb Referral to Cardiac Rehabilitation   Complete by:  As directed    Diagnosis:   STEMI Coronary Stents     Call MD for:  redness, tenderness, or signs of infection (pain, swelling, redness, odor or green/yellow discharge around incision site)   Complete by:  As directed    Diet - low sodium heart healthy   Complete by:  As directed    Discharge instructions   Complete by:  As directed    Groin Site Care Refer to this sheet in the next few weeks. These instructions provide you with information on caring for yourself after your procedure. Your caregiver may also give you more specific instructions. Your treatment has been planned according to current medical practices, but problems sometimes occur. Call  your caregiver if you have any problems or questions after your procedure. HOME CARE INSTRUCTIONS You may shower 24 hours after the procedure. Remove the bandage (dressing) and gently wash the site with plain soap and water. Gently pat the site dry.  Do not apply powder or lotion to the site.  Do not sit in a bathtub, swimming pool, or whirlpool for 5 to 7 days.  No bending, squatting, or lifting anything over 10 pounds (4.5 kg) as directed by your caregiver.  Inspect the site at least twice daily.  Do not drive home if you are discharged the same day of the procedure. Have someone else drive you.  You may drive 24 hours after the procedure unless otherwise instructed by your caregiver.  What to expect: Any bruising will usually fade within 1 to 2 weeks.  Blood that collects in the tissue (hematoma) may be painful to the touch. It should usually decrease in size and tenderness within 1 to 2 weeks.    SEEK IMMEDIATE MEDICAL CARE IF: You have unusual pain at the groin site or down the affected leg.  You have redness, warmth, swelling, or pain at the groin site.  You have drainage (other than a small amount of blood on the dressing).  You have chills.  You have a fever or persistent symptoms for more than 72 hours.  You have a fever and your symptoms suddenly get worse.  Your leg becomes pale, cool, tingly, or numb.  You have heavy bleeding from the site. Hold pressure on the site. Marland Kitchen  PLEASE DO NOT MISS ANY DOSES OF YOUR BRILINTA!!!!! Also keep a log of you blood pressures and bring back to your follow up appt. Please call the office with any questions.   Patients taking blood thinners should generally stay away from medicines like ibuprofen, Advil, Motrin, naproxen, and Aleve due to risk of stomach bleeding. You may take Tylenol as directed or talk to your primary doctor about alternatives.   Increase activity slowly   Complete by:  As directed       Discharge Medications   Allergies as of 03/06/2018      Reactions   Codeine Nausea And Vomiting   Statins    Patient says cannot tolerate statins d/t muscle cramping.  She is taking Simvastatin 40mg  PTA, reports she can tolerate simvastatin better.       Medication List    STOP taking these medications   aspirin EC 325 MG tablet Replaced by:  aspirin 81 MG chewable tablet   GARLIC PO   ibuprofen 200 MG tablet Commonly known as:  ADVIL,MOTRIN   pseudoephedrine 30 MG tablet Commonly known as:  SUDAFED     TAKE these medications   aspirin 81 MG chewable tablet Chew 1 tablet (81 mg total) by mouth daily. Start taking on:  03/07/2018 Replaces:  aspirin EC 325 MG tablet   ezetimibe 10 MG tablet Commonly known as:  ZETIA Take 1 tablet (10 mg total) by mouth daily. Start taking on:  03/07/2018   metoprolol tartrate 25 MG tablet Commonly known as:  LOPRESSOR Take 1 tablet (25 mg total) by mouth 2 (two) times daily. What  changed:  how much to take   nitroGLYCERIN 0.4 MG SL tablet Commonly known as:  NITROSTAT Place 0.4 mg under the tongue every 5 (five) minutes as needed for chest pain.   simvastatin 40 MG tablet Commonly known as:  ZOCOR TAKE 1 TABLET BY MOUTH AT BEDTIME   ticagrelor  90 MG Tabs tablet Commonly known as:  BRILINTA Take 1 tablet (90 mg total) by mouth 2 (two) times daily.        Aspirin prescribed at discharge?  Yes High Intensity Statin Prescribed? (Lipitor 40-80mg  or Crestor 20-40mg ): No: Intolerant, consider referral for PCSK9s as outpatient. Beta Blocker Prescribed? Yes For EF <40%, was ACEI/ARB Prescribed? No: EF ok ADP Receptor Inhibitor Prescribed? (i.e. Plavix etc.-Includes Medically Managed Patients): Yes For EF <40%, Aldosterone Inhibitor Prescribed? Yes Was EF assessed during THIS hospitalization? No: EF ok Was Cardiac Rehab II ordered? (Included Medically managed Patients): Yes   Outstanding Labs/Studies   N/a   Duration of Discharge Encounter   Greater than 30 minutes including physician time.  Signed, Signed, Laverda PageLindsay Demaris Bousquet, NP-C 03/06/2018, 11:22 AM Pager: [phone]

## 2018-03-06 ENCOUNTER — Telehealth: Payer: Self-pay | Admitting: *Deleted

## 2018-03-06 DIAGNOSIS — E876 Hypokalemia: Secondary | ICD-10-CM

## 2018-03-06 LAB — CBC
HCT: 33.6 % — ABNORMAL LOW (ref 36.0–46.0)
HEMOGLOBIN: 11.2 g/dL — AB (ref 12.0–15.0)
MCH: 29.7 pg (ref 26.0–34.0)
MCHC: 33.3 g/dL (ref 30.0–36.0)
MCV: 89.1 fL (ref 78.0–100.0)
Platelets: 291 10*3/uL (ref 150–400)
RBC: 3.77 MIL/uL — AB (ref 3.87–5.11)
RDW: 13.2 % (ref 11.5–15.5)
WBC: 7.6 10*3/uL (ref 4.0–10.5)

## 2018-03-06 LAB — BASIC METABOLIC PANEL
ANION GAP: 9 (ref 5–15)
BUN: 14 mg/dL (ref 6–20)
CHLORIDE: 107 mmol/L (ref 101–111)
CO2: 20 mmol/L — ABNORMAL LOW (ref 22–32)
Calcium: 8.4 mg/dL — ABNORMAL LOW (ref 8.9–10.3)
Creatinine, Ser: 0.75 mg/dL (ref 0.44–1.00)
GFR calc Af Amer: >60 mL/min (ref >60)
GLUCOSE: 117 mg/dL — AB (ref 65–99)
POTASSIUM: 3.4 mmol/L — AB (ref 3.5–5.1)
Sodium: 136 mmol/L (ref 135–145)

## 2018-03-06 LAB — CULTURE, BLOOD (ROUTINE X 2)
CULTURE: NO GROWTH
Culture: NO GROWTH
SPECIAL REQUESTS: ADEQUATE
SPECIAL REQUESTS: ADEQUATE

## 2018-03-06 MED ORDER — POTASSIUM CHLORIDE CRYS ER 20 MEQ PO TBCR
40.0000 meq | EXTENDED_RELEASE_TABLET | Freq: Once | ORAL | Status: AC
  Administered 2018-03-06: 40 meq via ORAL
  Filled 2018-03-06: qty 2

## 2018-03-06 MED ORDER — NITROGLYCERIN 0.4 MG SL SUBL: 0.4000 mg | SUBLINGUAL_TABLET | SUBLINGUAL | 2 refills | Status: DC | PRN

## 2018-03-06 MED ORDER — SIMVASTATIN 40 MG PO TABS: 40.0000 mg | ORAL_TABLET | Freq: Every day | ORAL | 0 refills | Status: DC

## 2018-03-06 MED ORDER — TICAGRELOR 90 MG PO TABS: 90.0000 mg | ORAL_TABLET | Freq: Two times a day (BID) | ORAL | 10 refills | Status: DC

## 2018-03-06 MED ORDER — POTASSIUM CHLORIDE CRYS ER 20 MEQ PO TBCR: 20.0000 meq | EXTENDED_RELEASE_TABLET | Freq: Every day | ORAL | 3 refills | Status: DC

## 2018-03-06 MED ORDER — ASPIRIN 81 MG PO CHEW: 81.0000 mg | CHEWABLE_TABLET | Freq: Every day | ORAL | Status: AC

## 2018-03-06 MED ORDER — EZETIMIBE 10 MG PO TABS: 10.0000 mg | ORAL_TABLET | Freq: Every day | ORAL | 1 refills | Status: DC

## 2018-03-06 MED ORDER — METOPROLOL TARTRATE 25 MG PO TABS: 25.0000 mg | ORAL_TABLET | Freq: Two times a day (BID) | ORAL | 1 refills | Status: DC

## 2018-03-06 NOTE — Telephone Encounter (Signed)
Potassium 40 meq given to patient prior to d/c today, verified in Epic. Rx sent to [facility] and patient will go for labs in 10 days

## 2018-03-06 NOTE — Telephone Encounter (Signed)
Per patient she had 2 Potassium today and yesterday at the hospi

## 2018-03-06 NOTE — Telephone Encounter (Signed)
-----   Message from Rollene RotundaJames Hochrein, MD sent at 03/06/2018 10:34 AM EDT ----- Potassium is mildly low.   She should take 40 meq of Kdur today and then start 20 meq PO daily.  Repeat BMET in ten days.  Call Ms. Daphine DeutscherMartin with the results and send results

## 2018-03-06 NOTE — Progress Notes (Signed)
CARDIAC REHAB PHASE I   PRE:  Rate/Rhythm: 75 SR  BP:  Supine: 100/71  Sitting:   Standing:    SaO2:   MODE:  Ambulation: 500 ft   POST:  Rate/Rhythm: 99 SR  BP:  Supine:   Sitting: 102/84  Standing:    SaO2: 99%RA 1100-1116 Pt walked 500 ft on RA with hand held asst with steady gait. No CP. Tolerated well. Reinforced how important to take brilinta.   Luetta Nuttingharlene Jackson Coffield, RN BSN  03/06/2018 11:14 AM

## 2018-03-06 NOTE — Progress Notes (Signed)
Discharge order obtained.  IV removed intact, telemetry monitor removed.  Reviewed AVS with patient, including medications, activity/restrictions, follow-up appointments.  Patient verbalized understanding.  Questions asked and answered.  Belongings given to family/patient.  Copy of AVS signature form placed in chart.   

## 2018-03-07 ENCOUNTER — Telehealth: Payer: Self-pay

## 2018-03-07 NOTE — Telephone Encounter (Signed)
Attempted to reach for f/u apt info 03/24/18 at 1230 with M Lenze PA-C, no answer, no machine

## 2018-03-07 NOTE — Telephone Encounter (Signed)
-----   Message from Vallarie MareHannah E Peters sent at 03/06/2018 10:28 AM EDT ----- TOC scheduled to see Leda GauzeM. Lenze on 03/24/18  Thank you

## 2018-03-14 NOTE — Telephone Encounter (Signed)
Second attempt to reach pt for TOC, no answer, no machine

## 2018-03-18 ENCOUNTER — Telehealth: Payer: Self-pay

## 2018-03-18 NOTE — Telephone Encounter (Signed)
-----   Message from Hannah E Peters sent at 03/06/2018 10:28 AM EDT ----- TOC scheduled to see M. Lenze on 03/24/18  Thank you 

## 2018-03-18 NOTE — Telephone Encounter (Signed)
  Patient [facility] on 03[facility]18.  Patient understands to follow up with provider Lenze on 4/8 at 1230 at [place]. Patient understands discharge instructions? yes Patient understands medications and regiment? yes Patient understands to bring all medications to this visit? yes

## 2018-03-19 DIAGNOSIS — I255 Ischemic cardiomyopathy: Secondary | ICD-10-CM | POA: Insufficient documentation

## 2018-03-19 NOTE — Progress Notes (Signed)
Cardiology Office Note    Date:  03/24/2018   ID:  Kelsey Padilla, DOB 04/25/1966, MRN [id number]  PCP:  [facility]  Cardiologist: New to [place]  Chief Complaint  Patient presents with  . Hospitalization Follow-up    History of Present Illness:  Kelsey Padilla is a 52 y.o. female with history of CAD status post CABG x3 in 2012 with a LIMA to the LAD, SVG to OM, SVG to PDA who presented to [facility] 02/28/18 with STEMI and underwent DES to the RCA there was downstream clot occlusion and IABP was placed with ongoing ischemia.  Troponin peaked at 65. 2D echo showed LVEF 45-50% with hypokinesis of the inferior myocardium, also was felt to have community-acquired pneumonia treated with azithromycin and ceftriaxone.  Zetia was added to simvastatin for better control.  Referred to lipid clinic the patient does not have insurance and probably will not be able to afford.  Patient discharged on 03/06/18.  Patient comes in today for follow-up.  Overall she is feeling well other than fatigue and weakness.  She denies chest pain, palpitations, dyspnea, dyspnea on exertion, dizziness or presyncope.  Her blood pressure continues to stay low.  She is scheduled to have blood work to follow-up her potassium at the health department tomorrow.  Still smoking 5 cigarettes a day but trying to quit.  Past Medical History:  Diagnosis Date  . Anemia, macrocytic   . Community acquired pneumonia 03/01/2018  . Coronary artery disease   . Hyperlipidemia   . 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 LIMA to LAD, SVG to OM, SVG to RPDA  . Tobacco abuse   . Vitamin B12 deficiency     Past Surgical History:  Procedure Laterality Date  . CARDIAC CATHETERIZATION  07/2011   significnat distal left main and 3 vessel CAD. Normal EF.   Marland Kitchen CORONARY ARTERY BYPASS GRAFT    . CORONARY STENT INTERVENTION N/A 02/28/2018   Procedure: CORONARY STENT INTERVENTION;  Surgeon: Corky Crafts, MD;  Location:  [facility] INVASIVE CV LAB;  Service: Cardiovascular;  Laterality: N/A;  . IABP INSERTION N/A 02/28/2018   Procedure: IABP Insertion;  Surgeon: Corky Crafts, MD;  Location: [facility] INVASIVE CV LAB;  Service: Cardiovascular;  Laterality: N/A;  . LEFT HEART CATH AND CORONARY ANGIOGRAPHY N/A 02/28/2018   Procedure: LEFT HEART CATH AND CORONARY ANGIOGRAPHY;  Surgeon: Corky Crafts, MD;  Location: [facility] INVASIVE CV LAB;  Service: Cardiovascular;  Laterality: N/A;  . LEFT HEART CATH AND CORS/GRAFTS ANGIOGRAPHY N/A 02/28/2018   Procedure: LEFT HEART CATH AND CORS/GRAFTS ANGIOGRAPHY;  Surgeon: Corky Crafts, MD;  Location: [facility] INVASIVE CV LAB;  Service: Cardiovascular;  Laterality: N/A;  . RIGHT HEART CATH N/A 02/28/2018   Procedure: RIGHT HEART CATH;  Surgeon: Corky Crafts, MD;  Location: [facility] INVASIVE CV LAB;  Service: Cardiovascular;  Laterality: N/A;    Current Medications: Current Meds  Medication Sig  . aspirin 81 MG chewable tablet Chew 1 tablet (81 mg total) by mouth daily.  Marland Kitchen ezetimibe (ZETIA) 10 MG tablet Take 1 tablet (10 mg total) by mouth daily.  . metoprolol tartrate (LOPRESSOR) 25 MG tablet Take 1 tablet (25 mg total) by mouth 2 (two) times daily.  . nitroGLYCERIN (NITROSTAT) 0.4 MG SL tablet Place 1 tablet (0.4 mg total) under the tongue every 5 (five) minutes as needed.  . potassium chloride SA (K-DUR,KLOR-CON) 20 MEQ tablet Take 1 tablet (20 mEq total) by mouth daily.  Marland Kitchen  simvastatin (ZOCOR) 40 MG tablet Take 1 tablet (40 mg total) by mouth at bedtime.  . ticagrelor (BRILINTA) 90 MG TABS tablet Take 1 tablet (90 mg total) by mouth 2 (two) times daily.     Allergies:   Codeine and Statins   Social History   Socioeconomic History  . Marital status: Married    Spouse name: Not on file  . Number of children: Not on file  . Years of education: Not on file  . Highest education level: Not on file  Occupational History  . Not on file  Social Needs  . Financial resource  strain: Not on file  . Food insecurity:    Worry: Not on file    Inability: Not on file  . Transportation needs:    Medical: Not on file    Non-medical: Not on file  Tobacco Use  . Smoking status: Current Some Day Smoker    Packs/day: 0.80    Years: 20.00    Pack years: 16.00    Types: Cigarettes    Last attempt to quit: 08/16/2011    Years since quitting: 6.6  . Smokeless tobacco: Never Used  . Tobacco comment: one pack last 4 days  Substance and Sexual Activity  . Alcohol use: No  . Drug use: No  . Sexual activity: Not on file  Lifestyle  . Physical activity:    Days per week: Not on file    Minutes per session: Not on file  . Stress: Not on file  Relationships  . Social connections:    Talks on phone: Not on file    Gets together: Not on file    Attends religious service: Not on file    Active member of club or organization: Not on file    Attends meetings of clubs or organizations: Not on file    Relationship status: Not on file  Other Topics Concern  . Not on file  Social History Narrative   Has 50 year old daughter and 35104 year old son   Has 1 grandchild 10815 mths old     Family History:  The patient's family history includes Cancer in her father and mother; Diabetes in her mother; Heart disease in her mother; Heart failure in her mother; High blood pressure in her father.   ROS:   Please see the history of present illness.    Review of Systems  Constitution: Positive for malaise/fatigue.  HENT: Negative.   Eyes: Negative.   Cardiovascular: Negative.   Respiratory: Negative.   Hematologic/Lymphatic: Negative.   Musculoskeletal: Negative.  Negative for joint pain.  Gastrointestinal: Negative.   Genitourinary: Negative.   Neurological: Positive for weakness.   All other systems reviewed and are negative.   PHYSICAL EXAM:   VS:  BP (!) 98/56   Pulse 93   Ht 5\' 3"  (1.6 m)   Wt 156 lb 9.6 oz (71 kg)   SpO2 97%   BMI 27.74 kg/m   Physical Exam  GEN: Well  nourished, well developed, in no acute distress  Neck: no JVD, carotid bruits, or masses Cardiac:RRR; no murmurs, rubs, or gallops  Respiratory:  clear to auscultation bilaterally, normal work of breathing GI: soft, nontender, nondistended, + BS Ext: Right groin at cath site and balloon pump without hematoma or hemorrhage, good distal pulses.  Otherwise without cyanosis, clubbing, or edema, Good distal pulses bilaterally Neuro:  Alert and Oriented x 3,  Psych: euthymic mood, full affect  Wt Readings from Last 3  Encounters:  03/24/18 156 lb 9.6 oz (71 kg)  03/06/18 157 lb 10.1 oz (71.5 kg)  02/13/13 172 lb 12.8 oz (78.4 kg)      Studies/Labs Reviewed:   EKG:  EKG is not ordered today.   Recent Labs: 02/28/2018: ALT 30 03/06/2018: BUN 14; Creatinine, Ser 0.75; Hemoglobin 11.2; Platelets 291; Potassium 3.4; Sodium 136   Lipid Panel    Component Value Date/Time   CHOL 301 (H) 02/28/2018 0247   TRIG 247 (H) 02/28/2018 0247   HDL 31 (L) 02/28/2018 0247   CHOLHDL 9.7 02/28/2018 0247   VLDL 49 (H) 02/28/2018 0247   LDLCALC 221 (H) 02/28/2018 0247    Additional studies/ records that were reviewed today include:    Echocardiogram 03/02/18: Study Conclusions   - Left ventricle: The cavity size was normal. Systolic function was   mildly reduced. The estimated ejection fraction was in the range   of 45% to 50%. Hypokinesis of the inferior myocardium. - Mitral valve: There was mild regurgitation.   Left heart catheterization 02/28/18: Conclusion     Prox RCA to Mid RCA lesion is 75% stenosed.  Dist RCA lesion is 100% stenosed. SVG to PDA is occluded.  Mid LM to Ost LAD lesion is 75% stenosed.  Prox LAD to Mid LAD lesion is 100% stenosed. LIMA to LAD.  Origin to Prox Graft lesion is 40% stenosed.  Ost 2nd Mrg lesion is 100% stenosed. SVG to OM Dist Graft to Insertion lesion is 100% stenosed. This is the culprit.  A drug-eluting stent was successfully placed using a STENT  SYNERGY DES 3.5X38.  Post intervention, there is a 0% residual stenosis. However, microvascular occlusion will cause ongoing injury.  The left ventricular ejection fraction is 25-35% by visual estimate.  There is moderate to severe left ventricular systolic dysfunction.  LV end diastolic pressure is moderately elevated.  There is no aortic valve stenosis.  Ao sat 98%, PA sat 61%, PA mean 18 mm Hg; PCWP 17 mm Hg  IABP placed   Decreased EF increased LVEDP.  BP stable.  IABP placed given ongoing ischemia due to no reflow.     DAPT for 1 year.   She will need aggressive secondary prevention and CHF management.       ASSESSMENT:    1. Coronary artery disease involving native coronary artery of native heart without angina pectoris   2. Ischemic cardiomyopathy   3. Mixed hyperlipidemia   4. Tobacco abuse      PLAN:  In order of problems listed above:  CAD status post previous CABG admitted with STEMI treated with PCI to the RCA with down stream clot occlusion and ongoing ischemia requiring IABP.  Complicated by pneumonia.  Patient recovering nicely.  Biggest complaint is fatigue and low blood pressure although she has no dizziness.  We will continue low-dose metoprolol, Brilinta, aspirin Zocor and Zetia.  Follow-up in our [place] office to become established with cardiologist in 6 weeks.  Ischemic cardiomyopathy ejection fraction 45% with fluid overload in the hospital.  Stable without fluid overload today.  Was also hypokalemic in the hospital and potassium being checked tomorrow at the health department.  She is on supplemental potassium  Mixed hyperlipidemia Zetia added to Zocor but could probably benefit from Repatha. without insurance cost could be an issue.  Currently she is being followed by the health department and awaiting Medicaid approval.  Continue Zetia and Zocor.  Follow-up lipids in 6 weeks  Tobacco abuse the importance of smoking  cessation discussed with  patient.  Medication Adjustments/Labs and Tests Ordered: Current medicines are reviewed at length with the patient today.  Concerns regarding medicines are outlined above.  Medication changes, Labs and Tests ordered today are listed in the Patient Instructions below. Patient Instructions  Medication Instructions:  Your physician recommends that you continue on your current medications as directed. Please refer to the Current Medication list given to you today.   Labwork: Your physician recommends that you return for lab work in: 6 WEEKS ( Fasting 05/05/18)    Testing/Procedures: NONE   Follow-Up: Your physician recommends that you schedule a follow-up appointment in: 6 Weeks in [place] office.    Any Other Special Instructions Will Be Listed Below (If Applicable).     If you need a refill on your cardiac medications before your next appointment, please call your pharmacy.  Thank you for choosing [facility]!       Elson Clan, PA-C  03/24/2018 12:46 PM    [facility] [address] [place], [place], [place]  [address] Phone: [phone]; Fax: [phone]

## 2018-03-24 ENCOUNTER — Ambulatory Visit (INDEPENDENT_AMBULATORY_CARE_PROVIDER_SITE_OTHER): Payer: Medicaid Other | Admitting: Physician Assistant

## 2018-03-24 ENCOUNTER — Encounter: Payer: Self-pay | Admitting: Physician Assistant

## 2018-03-24 VITALS — BP 98/56 | HR 93 | Ht 63.0 in | Wt 156.6 lb

## 2018-03-24 DIAGNOSIS — E782 Mixed hyperlipidemia: Secondary | ICD-10-CM

## 2018-03-24 DIAGNOSIS — I2510 Atherosclerotic heart disease of native coronary artery without angina pectoris: Secondary | ICD-10-CM | POA: Diagnosis not present

## 2018-03-24 DIAGNOSIS — I255 Ischemic cardiomyopathy: Secondary | ICD-10-CM | POA: Diagnosis not present

## 2018-03-24 DIAGNOSIS — Z720 Tobacco use: Secondary | ICD-10-CM

## 2018-03-24 NOTE — Patient Instructions (Signed)
Medication Instructions:  Your physician recommends that you continue on your current medications as directed. Please refer to the Current Medication list given to you today.   Labwork: Your physician recommends that you return for lab work in: 6 WEEKS ( Fasting 05/05/18)    Testing/Procedures: NONE   Follow-Up: Your physician recommends that you schedule a follow-up appointment in: 6 Weeks in [place] office.    Any Other Special Instructions Will Be Listed Below (If Applicable).     If you need a refill on your cardiac medications before your next appointment, please call your pharmacy.  Thank you for choosing [facility]!

## 2018-03-31 ENCOUNTER — Telehealth: Payer: Self-pay | Admitting: Physician Assistant

## 2018-03-31 NOTE — Telephone Encounter (Signed)
Pt is calling wanting to know results from her labs

## 2018-03-31 NOTE — Telephone Encounter (Signed)
Ok to stop potassium pills   Kelsey FerryJ Amneet Cendejas MD

## 2018-03-31 NOTE — Telephone Encounter (Signed)
F[place].

## 2018-03-31 NOTE — Telephone Encounter (Signed)
Pt called to see if she still needs to continue taking her potassium 20 meq daily. She did repeat lab work, her potassium is now 5.0. A copy of her lab results were placed on Dr. Verna CzechBranch's  desk for review.

## 2018-03-31 NOTE — Telephone Encounter (Signed)
Can [facility]. Staci can you bring to me when they arrive   Dominga FerryJ Savon Cobbs MD

## 2018-04-01 NOTE — Telephone Encounter (Signed)
Called pt, spoke with husband. Advised him that it was ok for pt to stop potassium pills.

## 2018-04-20 ENCOUNTER — Other Ambulatory Visit: Payer: Self-pay | Admitting: Cardiology

## 2018-05-05 ENCOUNTER — Telehealth: Payer: Self-pay | Admitting: Cardiology

## 2018-05-05 NOTE — Telephone Encounter (Signed)
Patient advised not to take anything with Sudafed in it because it can affect her heart rate & BP.  May use Tylenol Sinus as this has the Phenylephrine in it instead of the Pseudoephedrine.  Stated that she has been doing Zyrtec, Tussionex, & Saline for her nose.  Advised that she is doing all the right things, but if no better within 7-10 days may want to see pmd to rule out infection.  She verbalized understanding.

## 2018-05-05 NOTE — Telephone Encounter (Signed)
Patient is asking what she can take for "sinus infection". / tg

## 2018-05-14 ENCOUNTER — Ambulatory Visit: Payer: Self-pay | Admitting: Cardiology

## 2018-05-22 ENCOUNTER — Other Ambulatory Visit: Payer: Self-pay | Admitting: Cardiology

## 2018-05-22 NOTE — Telephone Encounter (Signed)
[facility] for [facility] assistance. States that she hasn't heard anything and was checking to see if we received. / tg

## 2018-05-26 ENCOUNTER — Ambulatory Visit: Payer: Medicaid Other | Admitting: Cardiology

## 2018-05-26 ENCOUNTER — Encounter: Payer: Self-pay | Admitting: *Deleted

## 2018-05-26 ENCOUNTER — Other Ambulatory Visit: Payer: Self-pay

## 2018-05-26 ENCOUNTER — Telehealth: Payer: Self-pay | Admitting: Cardiology

## 2018-05-26 ENCOUNTER — Encounter: Payer: Self-pay | Admitting: Cardiology

## 2018-05-26 VITALS — BP 107/74 | HR 85 | Ht 63.0 in | Wt 155.0 lb

## 2018-05-26 DIAGNOSIS — R0989 Other specified symptoms and signs involving the circulatory and respiratory systems: Secondary | ICD-10-CM

## 2018-05-26 DIAGNOSIS — I2510 Atherosclerotic heart disease of native coronary artery without angina pectoris: Secondary | ICD-10-CM | POA: Diagnosis not present

## 2018-05-26 DIAGNOSIS — E782 Mixed hyperlipidemia: Secondary | ICD-10-CM

## 2018-05-26 MED ORDER — METOPROLOL TARTRATE 25 MG PO TABS: 12.5000 mg | ORAL_TABLET | Freq: Two times a day (BID) | ORAL | 1 refills | Status: DC

## 2018-05-26 NOTE — Patient Instructions (Signed)
Your physician wants you to follow-up in: 6 MON[facility]S [facility] You will receive a reminder letter in the mail two months in advance. If you don't receive a letter, please call our office to schedule the follow-up appointment.  Your physician has recommended you make the following change in your medication:   DECREASE LOPRESSOR 12.5 MG (1/2 TABLET) TWICE DAILY   Your physician has requested that you have a carotid duplex. This test is an ultrasound of the carotid arteries in your neck. It looks at blood flow through these arteries that supply the brain with blood. Allow one hour for this exam. There are no restrictions or special instructions.  Thank you for choosing [facility]!!

## 2018-05-26 NOTE — Telephone Encounter (Signed)
Carotid [facility] in [facility] [place]  05/28/18

## 2018-05-26 NOTE — Progress Notes (Signed)
Clinical Summary Ms. Daphine DeutscherMartin is a 52 y.o.female seen today for follow up fo the following medical problems.   1. CAD - history of CAD with CABG in 2012 LIMA to LAD, SVG-OM, SVG-RPDA.  - admit with STEMI 02/2018 - received DES to SVG-OM, ongoing occlusion due to downstream clot occlusion.  - medical therapy limited due to soft bp's at the time - 02/2018 echo LVEF 45-50%  - no recent chest pain. No SOB/DOE. Can have some fatigue.  - compliant with meds   2. Hyperlipidemia - LDL 221 during recent admission. - intolerant to atorva, rosuva.  - on simva, added on zetia during recent admission.  - emergency medicaid stops in August, working to get assistance with health department.    Past Medical History:  Diagnosis Date  . Anemia, macrocytic   . Community acquired pneumonia 03/01/2018  . Coronary artery disease   . Hyperlipidemia   . 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 LIMA to LAD, SVG to OM, SVG to RPDA  . Tobacco abuse   . Vitamin B12 deficiency      Allergies  Allergen Reactions  . Codeine Nausea And Vomiting  . Statins     Patient says cannot tolerate statins d/t muscle cramping.  She is taking Simvastatin 40mg  PTA, reports she can tolerate simvastatin better.      Current Outpatient Medications  Medication Sig Dispense Refill  . aspirin 81 MG chewable tablet Chew 1 tablet (81 mg total) by mouth daily.    Marland Kitchen. ezetimibe (ZETIA) 10 MG tablet TAKE ONE TABLET BY MOUTH EVERY DAY 30 tablet 3  . metoprolol tartrate (LOPRESSOR) 25 MG tablet TAKE ONE TABLET BY MOUTH TWICE DAILY 60 tablet 3  . nitroGLYCERIN (NITROSTAT) 0.4 MG SL tablet Place 1 tablet (0.4 mg total) under the tongue every 5 (five) minutes as needed. 25 tablet 2  . potassium chloride SA (K-DUR,KLOR-CON) 20 MEQ tablet Take 1 tablet (20 mEq total) by mouth daily. 30 tablet 3  . simvastatin (ZOCOR) 40 MG tablet Take 1 tablet (40 mg total) by mouth at bedtime. 90 tablet 0  . ticagrelor (BRILINTA) 90 MG TABS tablet  Take 1 tablet (90 mg total) by mouth 2 (two) times daily. 60 tablet 10   No current facility-administered medications for this visit.      Past Surgical History:  Procedure Laterality Date  . CARDIAC CATHETERIZATION  07/2011   significnat distal left main and 3 vessel CAD. Normal EF.   Marland Kitchen. CORONARY ARTERY BYPASS GRAFT    . CORONARY STENT INTERVENTION N/A 02/28/2018   Procedure: CORONARY STENT INTERVENTION;  Surgeon: Corky CraftsVaranasi, Jayadeep S, MD;  [facility];  Service: Cardiovascular;  Laterality: N/A;  . IABP INSERTION N/A 02/28/2018   Procedure: IABP Insertion;  Surgeon: Corky CraftsVaranasi, Jayadeep S, MD;  [facility];  Service: Cardiovascular;  Laterality: N/A;  . LEFT HEART CATH AND CORONARY ANGIOGRAPHY N/A 02/28/2018   Procedure: LEFT HEART CATH AND CORONARY ANGIOGRAPHY;  Surgeon: Corky CraftsVaranasi, Jayadeep S, MD;  [facility];  Service: Cardiovascular;  Laterality: N/A;  . LEFT HEART CATH AND CORS/GRAFTS ANGIOGRAPHY N/A 02/28/2018   Procedure: LEFT HEART CATH AND CORS/GRAFTS ANGIOGRAPHY;  Surgeon: Corky CraftsVaranasi, Jayadeep S, MD;  [facility];  Service: Cardiovascular;  Laterality: N/A;  . RIGHT HEART CATH N/A 02/28/2018   Procedure: RIGHT HEART CATH;  Surgeon: Corky CraftsVaranasi, Jayadeep S, MD;  [facility];  Service: Cardiovascular;  Laterality:  N/A;     Allergies  Allergen Reactions  . Codeine Nausea And Vomiting  . Statins     Patient says cannot tolerate statins d/t muscle cramping.  She is taking Simvastatin 40mg  PTA, reports she can tolerate simvastatin better.       Family History  Problem Relation Age of Onset  . Cancer Mother   . Heart disease Mother   . Diabetes Mother   . Heart failure Mother   . Cancer Father   . High blood pressure Father      Social History Ms. Colunga reports that she has been smoking cigarettes.  She has a 16.00 pack-year smoking history. She has never used smokeless tobacco. Ms. Brammer  reports that she does not drink alcohol.   Review of Systems CONSTITUTIONAL: No weight loss, fever, chills, weakness or fatigue.  HEENT: Eyes: No visual loss, blurred vision, double vision or yellow sclerae.No hearing loss, sneezing, congestion, runny nose or sore throat.  SKIN: No rash or itching.  CARDIOVASCULAR: per hpi RESPIRATORY: No shortness of breath, cough or sputum.  GASTROINTESTINAL: No anorexia, nausea, vomiting or diarrhea. No abdominal pain or blood.  GENITOURINARY: No burning on urination, no polyuria NEUROLOGICAL: No headache, dizziness, syncope, paralysis, ataxia, numbness or tingling in the extremities. No change in bowel or bladder control.  MUSCULOSKELETAL: No muscle, back pain, joint pain or stiffness.  LYMPHATICS: No enlarged nodes. No history of splenectomy.  PSYCHIATRIC: No history of depression or anxiety.  ENDOCRINOLOGIC: No reports of sweating, cold or heat intolerance. No polyuria or polydipsia.  Marland Kitchen   Physical Examination Vitals:   05/26/18 1356  BP: 107/74  Pulse: 85  SpO2: 99%   Vitals:   05/26/18 1356  Weight: 155 lb (70.3 kg)  Height: 5\' 3"  (1.6 m)    Gen: resting comfortably, no acute distress HEENT: no scleral icterus, pupils equal round and reactive, no palptable cervical adenopathy,  CV: RRR, no m/r/g, no jvd. Bilateral carotid bruits Resp: Clear to auscultation bilaterally GI: abdomen is soft, non-tender, non-distended, normal bowel sounds, no hepatosplenomegaly MSK: extremities are warm, no edema.  Skin: warm, no rash Neuro:  no focal deficits Psych: appropriate affect   Diagnostic Studies 02/2018 cath  Prox RCA to Mid RCA lesion is 75% stenosed.  Dist RCA lesion is 100% stenosed. SVG to PDA is occluded.  Mid LM to Ost LAD lesion is 75% stenosed.  Prox LAD to Mid LAD lesion is 100% stenosed. LIMA to LAD.  Origin to Prox Graft lesion is 40% stenosed.  Ost 2nd Mrg lesion is 100% stenosed. SVG to OM Dist Graft to Insertion  lesion is 100% stenosed. This is the culprit.  A drug-eluting stent was successfully placed using a STENT SYNERGY DES 3.5X38.  Post intervention, there is a 0% residual stenosis. However, microvascular occlusion will cause ongoing injury.  The left ventricular ejection fraction is 25-35% by visual estimate.  There is moderate to severe left ventricular systolic dysfunction.  LV end diastolic pressure is moderately elevated.  There is no aortic valve stenosis.  Ao sat 98%, PA sat 61%, PA mean 18 mm Hg; PCWP 17 mm Hg  IABP placed\   02/2018 echo Study Conclusions  - Left ventricle: The cavity size was normal. Systolic function was   mildly reduced. The estimated ejection fraction was in the range   of 45% to 50%. Hypokinesis of the inferior myocardium. - Mitral valve: There was mild regurgitation.      Assessment and Plan  1. CAD -  no recent symptoms - some fatigue, we will try lowering lopressor to 12.5mg  bid  2. Hyperlipidemia - statin limitations as reported above - continue current therapy, request labs from pcp  3. Carotid bruits - obtain carotid US    F/u 6 months      Antoine Poche, M.D.

## 2018-05-28 ENCOUNTER — Ambulatory Visit (INDEPENDENT_AMBULATORY_CARE_PROVIDER_SITE_OTHER): Payer: Medicaid Other

## 2018-05-28 DIAGNOSIS — R0989 Other specified symptoms and signs involving the circulatory and respiratory systems: Secondary | ICD-10-CM | POA: Diagnosis not present

## 2018-05-29 ENCOUNTER — Encounter: Payer: Self-pay | Admitting: Cardiology

## 2018-06-02 NOTE — Telephone Encounter (Signed)
I spoke with pharmacist Debbie at health dept  [phone], she will refax rx's to be signed

## 2018-06-11 ENCOUNTER — Telehealth: Payer: Self-pay | Admitting: *Deleted

## 2018-06-11 NOTE — Telephone Encounter (Signed)
Pt aware - routed to pcp  

## 2018-06-11 NOTE — Telephone Encounter (Signed)
-----   Message from Antoine PocheJonathan F Branch, MD sent at 06/09/2018  2:38 PM EDT ----- Carotid US shows moderate blockages on both sides, we will continue to monitor at this time.   Dominga FerryJ Branch MD

## 2018-06-16 ENCOUNTER — Encounter: Payer: Medicaid Other | Admitting: Cardiology

## 2018-07-02 ENCOUNTER — Other Ambulatory Visit: Payer: Self-pay | Admitting: Cardiology

## 2018-07-20 ENCOUNTER — Encounter (HOSPITAL_COMMUNITY): Payer: Self-pay | Admitting: *Deleted

## 2018-07-20 ENCOUNTER — Emergency Department (HOSPITAL_COMMUNITY)
Admission: EM | Admit: 2018-07-20 | Discharge: 2018-07-20 | Disposition: A | Discharge status: Home / Self Care | Payer: Medicaid Other | Attending: Emergency Medicine | Admitting: Emergency Medicine

## 2018-07-20 ENCOUNTER — Other Ambulatory Visit: Payer: Self-pay

## 2018-07-20 ENCOUNTER — Emergency Department (HOSPITAL_COMMUNITY): Payer: Medicaid Other

## 2018-07-20 DIAGNOSIS — F17210 Nicotine dependence, cigarettes, uncomplicated: Secondary | ICD-10-CM | POA: Diagnosis not present

## 2018-07-20 DIAGNOSIS — I2510 Atherosclerotic heart disease of native coronary artery without angina pectoris: Secondary | ICD-10-CM | POA: Diagnosis not present

## 2018-07-20 DIAGNOSIS — I252 Old myocardial infarction: Secondary | ICD-10-CM | POA: Diagnosis not present

## 2018-07-20 DIAGNOSIS — Z951 Presence of aortocoronary bypass graft: Secondary | ICD-10-CM | POA: Diagnosis not present

## 2018-07-20 DIAGNOSIS — R079 Chest pain, unspecified: Secondary | ICD-10-CM | POA: Diagnosis not present

## 2018-07-20 DIAGNOSIS — Z79899 Other long term (current) drug therapy: Secondary | ICD-10-CM | POA: Diagnosis not present

## 2018-07-20 DIAGNOSIS — Z7982 Long term (current) use of aspirin: Secondary | ICD-10-CM | POA: Insufficient documentation

## 2018-07-20 LAB — CBC
HCT: 42.7 % (ref 36.0–46.0)
Hemoglobin: 13.6 g/dL (ref 12.0–15.0)
MCH: 30 pg (ref 26.0–34.0)
MCHC: 31.9 g/dL (ref 30.0–36.0)
MCV: 94.1 fL (ref 78.0–100.0)
Platelets: 273 10*3/uL (ref 150–400)
RBC: 4.54 MIL/uL (ref 3.87–5.11)
RDW: 13.9 % (ref 11.5–15.5)
WBC: 12.1 10*3/uL — ABNORMAL HIGH (ref 4.0–10.5)

## 2018-07-20 LAB — I-STAT TROPONIN, ED: Troponin i, poc: 0 ng/mL (ref 0.00–0.08)

## 2018-07-20 LAB — BASIC METABOLIC PANEL
Anion gap: 13 (ref 5–15)
BUN: 11 mg/dL (ref 6–20)
CO2: 21 mmol/L — ABNORMAL LOW (ref 22–32)
Calcium: 9.4 mg/dL (ref 8.9–10.3)
Chloride: 106 mmol/L (ref 98–111)
Creatinine, Ser: 0.85 mg/dL (ref 0.44–1.00)
GFR calc Af Amer: >60 mL/min (ref >60)
GFR calc non Af Amer: >60 mL/min (ref >60)
Glucose, Bld: 107 mg/dL — ABNORMAL HIGH (ref 70–99)
Potassium: 4.1 mmol/L (ref 3.5–5.1)
Sodium: 140 mmol/L (ref 135–145)

## 2018-07-20 LAB — I-STAT BETA HCG BLOOD, ED (MC, WL, AP ONLY): I-stat hCG, quantitative: <5 m[IU]/mL (ref <5)

## 2018-07-20 MED ORDER — TRAMADOL HCL 50 MG PO TABS: 50.0000 mg | ORAL_TABLET | Freq: Four times a day (QID) | ORAL | 0 refills | Status: DC | PRN

## 2018-07-20 NOTE — ED Notes (Signed)
D/c reviewed with patient 

## 2018-07-20 NOTE — ED Triage Notes (Signed)
Pt in c/o L sided CP onset x 6 days with radiation to L neck & L shoulder onset last night, denies n/v/d, denies SOB, PT A&O x4

## 2018-07-23 ENCOUNTER — Telehealth: Payer: Self-pay | Admitting: Cardiology

## 2018-07-23 NOTE — Telephone Encounter (Signed)
Patient recently discharged from hosp due to chest pain under her left breast.  She said they told her it was muscle strain and prescribed her medication

## 2018-07-23 NOTE — Telephone Encounter (Signed)
[facility] - Xray/EKG/labs done and came back normal - gave her tramadol for pain alternating with tylenol for pulled muscled in chest - pt says she is still hurting and was concerned with recent STEMI in March - hasn't taken a tramadol today only tylenol and was concerned about taking pain medication  - advised pt to take the tramadol as prescribed for pain by ED provider - pt denies any other symptoms at this time - will contact us if new or worsening symptoms occur - appreciative of call

## 2018-07-24 NOTE — ED Provider Notes (Signed)
[facility] EMERGENCY DEPARTMENT Provider Note   CSN: [id number] Arrival date & time: 07/20/18  1155     History   Chief Complaint Chief Complaint  Patient presents with  . Chest Pain    HPI Kelsey Padilla is a 52 y.o. female.  HPI   52 year old female with left-sided chest pain.  Onset 6 days ago.  Pain is been fairly constant since onset.  Radiates into the left neck and left shoulder.  Worse with certain movements.  She denies any associated symptoms such as nausea, shortness of breath, diaphoresis or palpitations.  No cough.  No fevers or chills.  No unusual leg pain or swelling.  Past Medical History:  Diagnosis Date  . Anemia, macrocytic   . Community acquired pneumonia 03/01/2018  . Coronary artery disease   . Hyperlipidemia   . 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 LIMA to LAD, SVG to OM, SVG to RPDA  . Tobacco abuse   . Vitamin B12 deficiency     Patient Active Problem List   Diagnosis Date Noted  . Ischemic cardiomyopathy 03/19/2018  . Community acquired pneumonia 03/01/2018  . STEMI (ST elevation myocardial infarction) (HCC) 02/28/2018  . Acute MI, true posterior wall, initial episode of care (HCC) 02/28/2018  . Ankle pain 07/21/2012  . Anemia, macrocytic   . Coronary artery disease   . Tobacco abuse   . Hyperlipidemia   . 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 CARDIAC CATHETERIZATION  07/2011   significnat distal left main and 3 vessel CAD. Normal EF.   Marland Kitchen CORONARY ARTERY BYPASS GRAFT    . CORONARY STENT INTERVENTION N/A 02/28/2018   Procedure: CORONARY STENT INTERVENTION;  Surgeon: Corky Crafts, MD;  Location: [facility] INVASIVE CV LAB;  Service: Cardiovascular;  Laterality: N/A;  . IABP INSERTION N/A 02/28/2018   Procedure: IABP Insertion;  Surgeon: Corky Crafts, MD;  Location: [facility] INVASIVE CV LAB;  Service: Cardiovascular;  Laterality: N/A;  . LEFT HEART CATH AND CORONARY ANGIOGRAPHY N/A  02/28/2018   Procedure: LEFT HEART CATH AND CORONARY ANGIOGRAPHY;  Surgeon: Corky Crafts, MD;  Location: [facility] INVASIVE CV LAB;  Service: Cardiovascular;  Laterality: N/A;  . LEFT HEART CATH AND CORS/GRAFTS ANGIOGRAPHY N/A 02/28/2018   Procedure: LEFT HEART CATH AND CORS/GRAFTS ANGIOGRAPHY;  Surgeon: Corky Crafts, MD;  Location: [facility] INVASIVE CV LAB;  Service: Cardiovascular;  Laterality: N/A;  . RIGHT HEART CATH N/A 02/28/2018   Procedure: RIGHT HEART CATH;  Surgeon: Corky Crafts, MD;  Location: [facility] INVASIVE CV LAB;  Service: Cardiovascular;  Laterality: N/A;     OB History   None      Home Medications    Prior to Admission medications   Medication Sig Start Date End Date Taking? Authorizing Provider  acetaminophen (TYLENOL) 650 MG CR tablet Take 650 mg by mouth every 8 (eight) hours as needed for pain.   Yes [provider]  aspirin 81 MG chewable tablet Chew 1 tablet (81 mg total) by mouth daily. 03/07/18  Yes Arty Baumgartner, NP  Cyanocobalamin (VITAMIN B-12) 500 MCG LOZG Take 1 lozenge by mouth once a week.    Yes [provider]  diphenhydrAMINE (BENADRYL) 25 MG tablet Take 25 mg by mouth every 6 (six) hours as needed for allergies.   Yes [provider]  ezetimibe (ZETIA) 10 MG tablet TAKE ONE TABLET BY MOUTH EVERY DAY 04/21/18  Yes  Antoine Poche, MD  metoprolol tartrate (LOPRESSOR) 25 MG tablet Take 0.5 tablets (12.5 mg total) by mouth 2 (two) times daily. 05/26/18 08/24/18 Yes Branch, Dorothe Pea, MD  nitroGLYCERIN (NITROSTAT) 0.4 MG SL tablet Place 1 tablet (0.4 mg total) under the tongue every 5 (five) minutes as needed. 03/06/18  Yes Laverda Page B, NP  simvastatin (ZOCOR) 40 MG tablet TAKE 1 TABLET BY MOUTH EVERY DAY 07/02/18  Yes Arty Baumgartner, NP  ticagrelor (BRILINTA) 90 MG TABS tablet Take 1 tablet (90 mg total) by mouth 2 (two) times daily. 03/06/18  Yes Arty Baumgartner, NP  traMADol (ULTRAM) 50 MG tablet Take 1 tablet  (50 mg total) by mouth every 6 (six) hours as needed. 07/20/18   Raeford Razor, MD    Family History Family History  Problem Relation Age of Onset  . Cancer Mother   . Heart disease Mother   . Diabetes Mother   . Heart failure Mother   . Cancer Father   . High blood pressure Father     Social History Social History   Tobacco Use  . Smoking status: Current Some Day Smoker    Packs/day: 0.25    Years: 20.00    Pack years: 5.00    Types: Cigarettes    Last attempt to quit: 08/16/2011    Years since quitting: 6.9  . Smokeless tobacco: Never Used  Substance Use Topics  . Alcohol use: No  . Drug use: No     Allergies   Codeine and Statins   Review of Systems Review of Systems All systems reviewed and negative, other than as noted in HPI.   Physical Exam Updated Vital Signs BP 112/81   Pulse 88   Resp (!) 24   Ht 5\' 3"  (1.6 m)   Wt 70.3 kg   SpO2 98%   BMI 27.46 kg/m   Physical Exam  Constitutional: She appears well-developed and well-nourished. No distress.  HENT:  Head: Normocephalic and atraumatic.  Eyes: Conjunctivae are normal. Right eye exhibits no discharge. Left eye exhibits no discharge.  Neck: Neck supple.  Cardiovascular: Normal rate, regular rhythm and normal heart sounds. Exam reveals no gallop and no friction rub.  No murmur heard. Pulmonary/Chest: Effort normal and breath sounds normal. No respiratory distress. She exhibits tenderness.  Is to palpation left anterior chest wall towards the left axilla.  No overlying skin changes.  No crepitus.  Breath sounds clear/symmetric bilaterally.  Abdominal: Soft. She exhibits no distension. There is no tenderness.  Musculoskeletal: She exhibits no edema or tenderness.  Lower extremities symmetric as compared to each other. No calf tenderness. Negative Homan's. No palpable cords.   Neurological: She is alert.  Skin: Skin is warm and dry.  Psychiatric: She has a normal mood and affect. Her behavior is  normal. Thought content normal.  Nursing note and vitals reviewed.    ED Treatments / Results  Labs (all labs ordered are listed, but only abnormal results are displayed) Labs Reviewed  BASIC METABOLIC PANEL - Abnormal; Notable for the following components:      Result Value   CO2 21 (*)    Glucose, Bld 107 (*)    All other components within normal limits  CBC - Abnormal; Notable for the following components:   WBC 12.1 (*)    All other components within normal limits  I-STAT TROPONIN, ED  I-STAT BETA HCG BLOOD, ED ([facility], [facility], [facility] ONLY)    EKG EKG Interpretation  Date/Time:  Sunday July 20 2018 12:03:12 EDT Ventricular Rate:  87 PR Interval:  152 QRS Duration: 92 QT Interval:  400 QTC Calculation: 481 R Axis:   83 Text Interpretation:  Normal sinus rhythm Abnormal ECG Inferior MI (old or age indeterminate) Confirmed by Rebel Willcutt ([id number]) on 07/20/2018 12:46:40 PM   Radiology No results found.   Dg Chest 2 View  Result Date: 07/20/2018 CLINICAL DATA:  Left-sided chest pain EXAM: CHEST - 2 VIEW COMPARISON:  03/04/2018 FINDINGS: The heart size and mediastinal contours are within normal limits. Both lungs are clear. The visualized skeletal structures are unremarkable. IMPRESSION: No acute abnormality noted. Electronically Signed   By: Mark  Lukens M.D.   On: 07/20/2018 13:20    Procedures Procedures (including critical care time)  Medications Ordered in ED Medications - No data to display   Initial Impression / Assessment and Plan / ED Course  I have reviewed the triage vital signs and the nursing notes.  Pertinent labs & imaging results that were available during my care of the patient were reviewed by me and considered in my medical decision making (see chart for details).     52 year old female with chest pain.  Atypical for ACS.  Doubt PE, dissection or other emergent process.  Seems most consistent with chest wall pain.  Symptomatic treatment.  Return  precautions were discussed.  Outpatient follow-up otherwise. Final Clinical Impressions(s) / ED Diagnoses   Final diagnoses:  Chest pain, unspecified type    ED Discharge Orders         Ordered    traMADol (ULTRAM) 50 MG tablet  Every 6 hours PRN     08 /04/19 1651           Raeford RazorKohut, Daeton Kluth, MD 07/24/18 1627

## 2018-08-08 ENCOUNTER — Telehealth: Payer: Self-pay | Admitting: Cardiology

## 2018-08-08 NOTE — Telephone Encounter (Signed)
Complain of continued pain - still has pain that will not go away.  [facility] Dept is her pmd & has not seen them yet.  Feels like pain is all muscular.  Just recently went to ED & all testing was normal per patient.  Now going on x 2-3 weeks.  Stated that the Tramadol did not help at all.  Taking regular Tylenol now.  Trouble sleeping as well.  Neck, shoulder, arm, under armpit & down arm - all on left side.  She questions if this may be costochondritis.

## 2018-08-08 NOTE — Telephone Encounter (Signed)
This type of pain is outside of my speciality, symptoms don't appear to be cardiac,  needs to reach out to her pcp or if symptoms bad enough consider urgent care or repeat ER visit.   Dina RichJonathan Amiel Mccaffrey MD

## 2018-08-08 NOTE — Telephone Encounter (Signed)
Patient notified and verbalized understanding. 

## 2018-08-08 NOTE — Telephone Encounter (Signed)
Still have pain all over chest/back/shoulders - same symptoms as last time  Asking for muscle relaxers

## 2018-08-20 ENCOUNTER — Other Ambulatory Visit: Payer: Self-pay | Admitting: Cardiology

## 2018-09-09 ENCOUNTER — Other Ambulatory Visit: Payer: Self-pay | Admitting: *Deleted

## 2018-09-09 DIAGNOSIS — R0989 Other specified symptoms and signs involving the circulatory and respiratory systems: Secondary | ICD-10-CM

## 2018-09-10 ENCOUNTER — Telehealth: Payer: Self-pay | Admitting: Cardiology

## 2018-09-10 NOTE — Telephone Encounter (Signed)
Per Patient telephone conversation: She has no insurance and does not want to schedule any appts or testing at this time

## 2019-03-22 IMAGING — DX DG CHEST 2V
2 series · 2 of 2 positions shown · non-contrast
Comparison: 03/04/2018

CLINICAL DATA: Left-sided chest pain

EXAM:
CHEST - 2 VIEW

[chest pa]
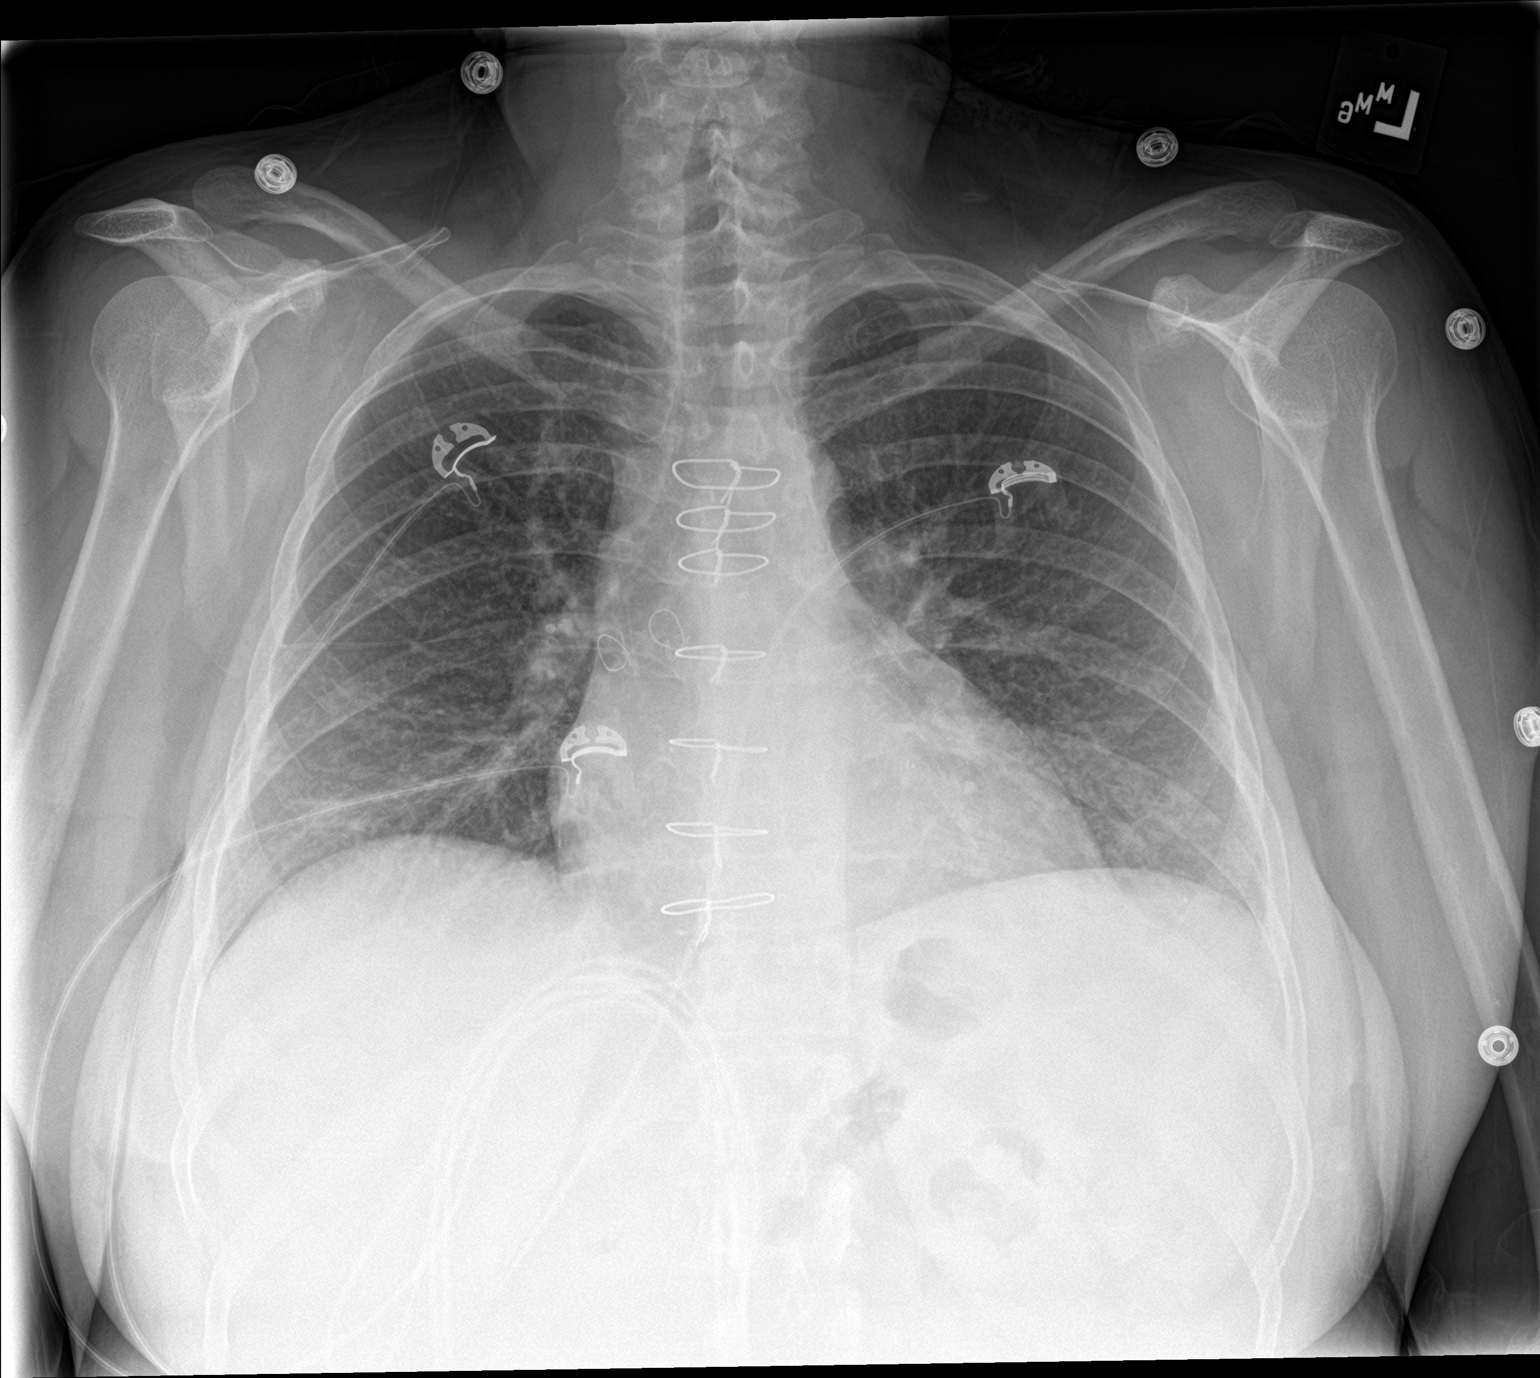

[chest lat]
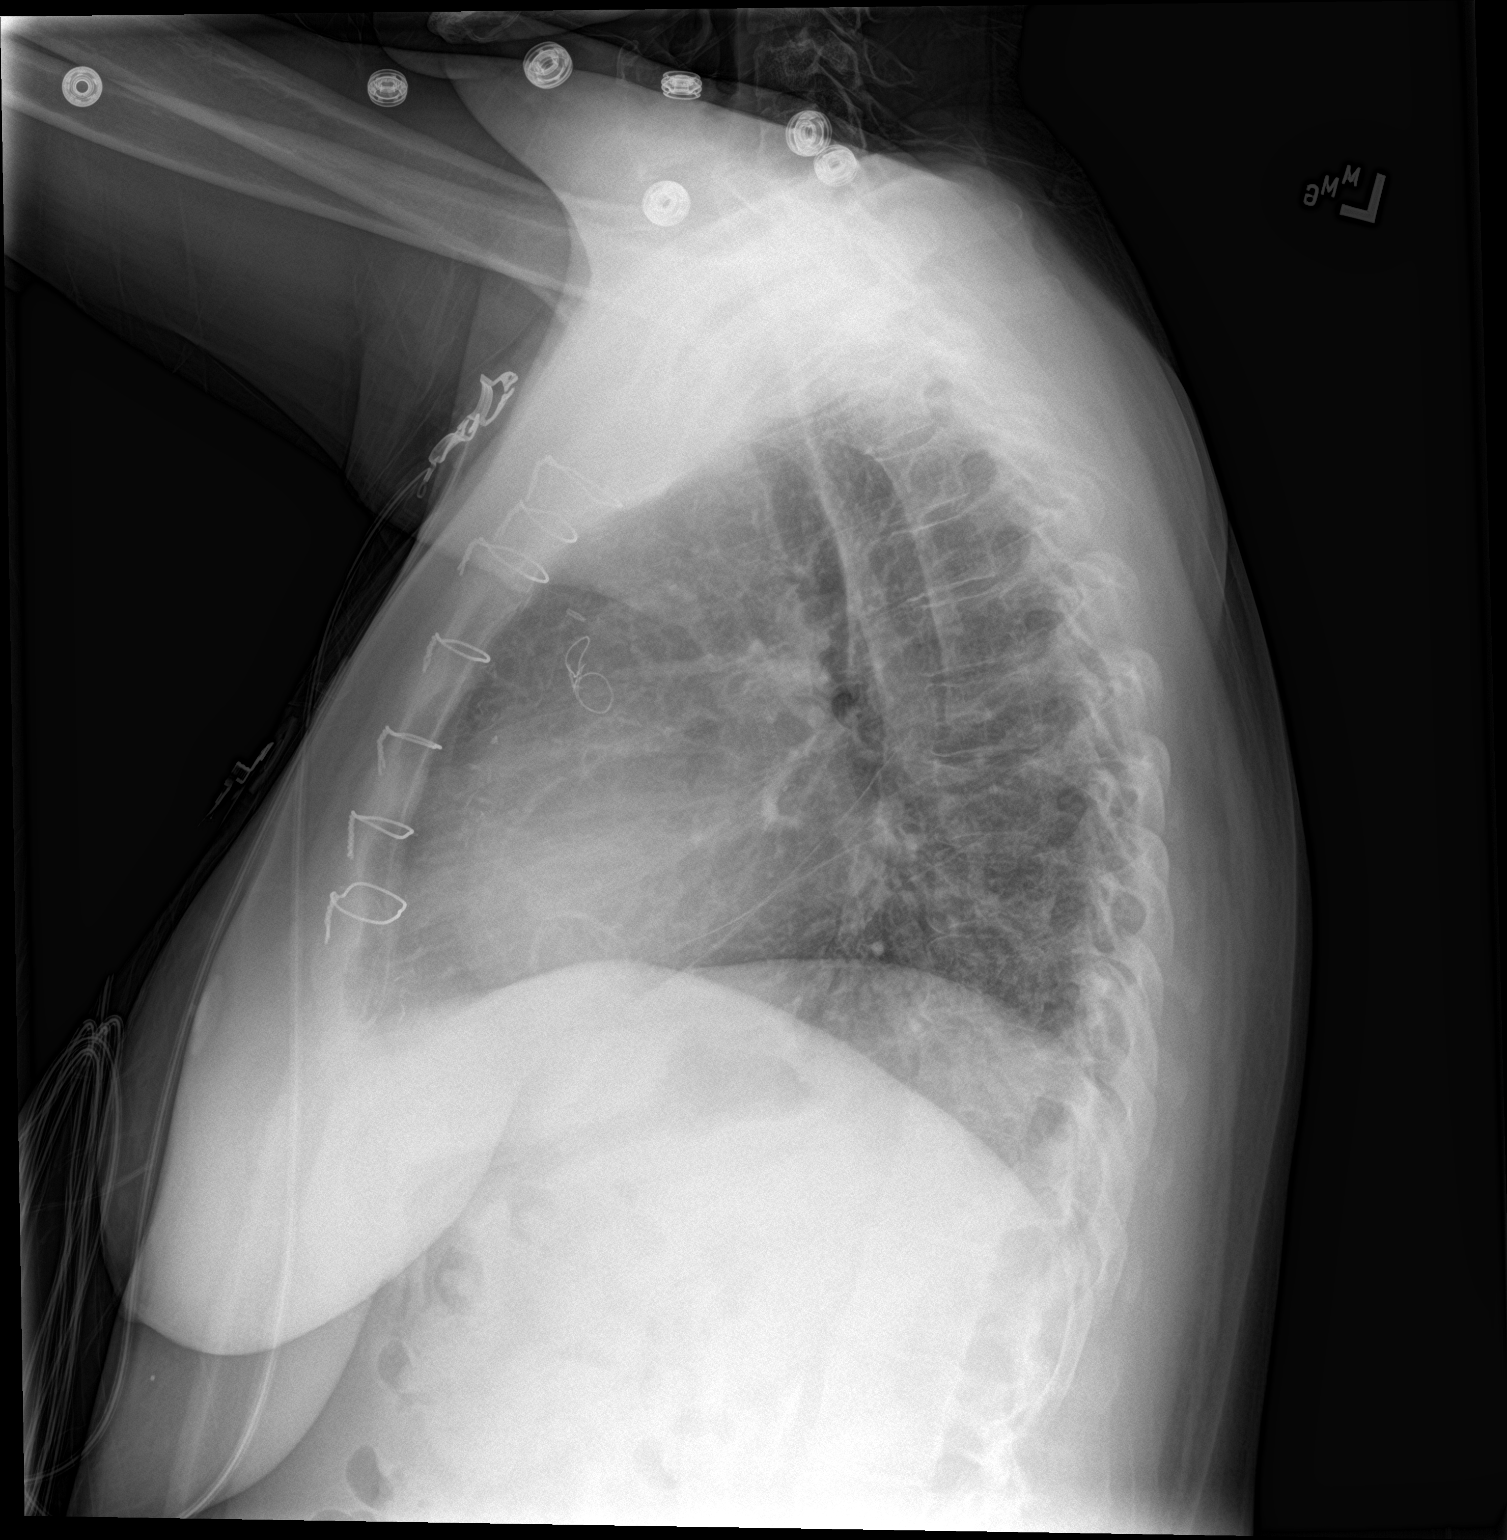

[2 of 2 positions shown; findings below may reference images not displayed]

FINDINGS: The heart size and mediastinal contours are within normal limits.
Both lungs are clear. The visualized skeletal structures are
unremarkable.
IMPRESSION: No acute abnormality noted.

## 2019-09-11 ENCOUNTER — Telehealth: Payer: Self-pay | Admitting: Cardiovascular Disease

## 2019-09-11 NOTE — Telephone Encounter (Signed)
Reports pain in left leg since March of last year. Says the pain is worse now and that she sees her PCP about this. Patient wants to know if this was caused by her heart. Reports having nerve damage in her whole body. Also says she has a burning sensation in her chest and back rated 8/10 that only happens when she is cleaning the tub or walk. Denies dizziness, chest pain or sob. Advised that less likely coming from her heart and that she needed to contact her PCP about her symptoms. Also advised that she was pass due for f/u in clinic. Patient says she is aware and does not have insurance at this time. Verbalized understanding of plan.

## 2019-09-11 NOTE — Telephone Encounter (Signed)
Since she had the stint shatter her left leg has hurt  She wonders if the stint caused this

## 2019-09-11 NOTE — Telephone Encounter (Signed)
Nonspecific symptoms, best bet would be to start with pcp.   Carlyle Dolly MD

## 2019-09-16 ENCOUNTER — Other Ambulatory Visit: Payer: Self-pay | Admitting: Cardiology

## 2019-09-16 ENCOUNTER — Ambulatory Visit (INDEPENDENT_AMBULATORY_CARE_PROVIDER_SITE_OTHER): Payer: Self-pay

## 2019-09-16 ENCOUNTER — Other Ambulatory Visit: Payer: Self-pay

## 2019-09-16 DIAGNOSIS — I6523 Occlusion and stenosis of bilateral carotid arteries: Secondary | ICD-10-CM

## 2019-09-16 DIAGNOSIS — R0989 Other specified symptoms and signs involving the circulatory and respiratory systems: Secondary | ICD-10-CM

## 2019-09-23 ENCOUNTER — Other Ambulatory Visit: Payer: Self-pay | Admitting: *Deleted

## 2019-09-23 ENCOUNTER — Telehealth: Payer: Self-pay | Admitting: *Deleted

## 2019-09-23 DIAGNOSIS — I2510 Atherosclerotic heart disease of native coronary artery without angina pectoris: Secondary | ICD-10-CM

## 2019-09-23 NOTE — Telephone Encounter (Signed)
-----   Message from Arnoldo Lenis, MD sent at 09/21/2019 10:35 AM EDT ----- Carotid US shows moderate blockages on both sides, we will continue to monitor  Zandra Abts MD

## 2019-09-23 NOTE — Telephone Encounter (Signed)
Pt aware - routed to pcp  

## 2019-10-22 ENCOUNTER — Telehealth: Payer: Self-pay | Admitting: Cardiology

## 2020-11-15 ENCOUNTER — Telehealth: Payer: Self-pay

## 2020-11-15 NOTE — Telephone Encounter (Signed)
This pt hasn't been seen in 2 years and past due for f/u - Spoke with health department [facility]) and made them aware that the pt would need an appointment with Dr Wyline Mood for [place] to complete these forms for [facility]

## 2020-11-15 NOTE — Telephone Encounter (Addendum)
**Note De-Identified Jorah Hua Obfuscation** An [place] and [place] asst application was emailed to me with the provider part was filled in with  Jacolyn Reedy, PA-c's information. The pt was seen by Jacolyn Reedy, PA-c last in our  office in 03/2018 other wise she sees Dr Wyline Mood in our [place] office.  I have forwarded the email to the [place] office to address as Dr Wyline Mood will need to sign.

## 2023-07-17 ENCOUNTER — Encounter (HOSPITAL_COMMUNITY): Payer: Self-pay | Admitting: Emergency Medicine

## 2023-07-17 ENCOUNTER — Emergency Department (HOSPITAL_COMMUNITY): Payer: Medicaid Other

## 2023-07-17 ENCOUNTER — Inpatient Hospital Stay (HOSPITAL_COMMUNITY)
Admission: EM | Admit: 2023-07-17 | Discharge: 2023-07-19 | DRG: 324 | Disposition: A | Discharge status: Home / Self Care | Payer: Medicaid Other | Attending: Internal Medicine | Admitting: Internal Medicine

## 2023-07-17 ENCOUNTER — Other Ambulatory Visit: Payer: Self-pay

## 2023-07-17 DIAGNOSIS — F17210 Nicotine dependence, cigarettes, uncomplicated: Secondary | ICD-10-CM | POA: Diagnosis present

## 2023-07-17 DIAGNOSIS — Z7902 Long term (current) use of antithrombotics/antiplatelets: Secondary | ICD-10-CM

## 2023-07-17 DIAGNOSIS — Z79899 Other long term (current) drug therapy: Secondary | ICD-10-CM

## 2023-07-17 DIAGNOSIS — I252 Old myocardial infarction: Secondary | ICD-10-CM

## 2023-07-17 DIAGNOSIS — Z882 Allergy status to sulfonamides status: Secondary | ICD-10-CM

## 2023-07-17 DIAGNOSIS — Z7982 Long term (current) use of aspirin: Secondary | ICD-10-CM

## 2023-07-17 DIAGNOSIS — Z720 Tobacco use: Secondary | ICD-10-CM | POA: Diagnosis present

## 2023-07-17 DIAGNOSIS — I5022 Chronic systolic (congestive) heart failure: Secondary | ICD-10-CM | POA: Diagnosis present

## 2023-07-17 DIAGNOSIS — Z951 Presence of aortocoronary bypass graft: Secondary | ICD-10-CM

## 2023-07-17 DIAGNOSIS — E119 Type 2 diabetes mellitus without complications: Secondary | ICD-10-CM | POA: Diagnosis present

## 2023-07-17 DIAGNOSIS — Z597 Insufficient social insurance and welfare support: Secondary | ICD-10-CM

## 2023-07-17 DIAGNOSIS — E785 Hyperlipidemia, unspecified: Secondary | ICD-10-CM | POA: Diagnosis present

## 2023-07-17 DIAGNOSIS — I214 Non-ST elevation (NSTEMI) myocardial infarction: Principal | ICD-10-CM | POA: Diagnosis present

## 2023-07-17 DIAGNOSIS — Z818 Family history of other mental and behavioral disorders: Secondary | ICD-10-CM

## 2023-07-17 DIAGNOSIS — Z8249 Family history of ischemic heart disease and other diseases of the circulatory system: Secondary | ICD-10-CM

## 2023-07-17 DIAGNOSIS — Z7984 Long term (current) use of oral hypoglycemic drugs: Secondary | ICD-10-CM

## 2023-07-17 DIAGNOSIS — I200 Unstable angina: Principal | ICD-10-CM

## 2023-07-17 DIAGNOSIS — I25110 Atherosclerotic heart disease of native coronary artery with unstable angina pectoris: Secondary | ICD-10-CM | POA: Diagnosis present

## 2023-07-17 DIAGNOSIS — E782 Mixed hyperlipidemia: Secondary | ICD-10-CM

## 2023-07-17 DIAGNOSIS — I25710 Atherosclerosis of autologous vein coronary artery bypass graft(s) with unstable angina pectoris: Secondary | ICD-10-CM | POA: Diagnosis present

## 2023-07-17 DIAGNOSIS — K047 Periapical abscess without sinus: Secondary | ICD-10-CM | POA: Diagnosis present

## 2023-07-17 DIAGNOSIS — I255 Ischemic cardiomyopathy: Secondary | ICD-10-CM | POA: Diagnosis present

## 2023-07-17 DIAGNOSIS — I110 Hypertensive heart disease with heart failure: Secondary | ICD-10-CM | POA: Diagnosis present

## 2023-07-17 DIAGNOSIS — Z885 Allergy status to narcotic agent status: Secondary | ICD-10-CM

## 2023-07-17 DIAGNOSIS — Z955 Presence of coronary angioplasty implant and graft: Secondary | ICD-10-CM

## 2023-07-17 DIAGNOSIS — Z833 Family history of diabetes mellitus: Secondary | ICD-10-CM

## 2023-07-17 LAB — BASIC METABOLIC PANEL
Anion gap: 8 (ref 5–15)
BUN: 14 mg/dL (ref 6–20)
CO2: 24 mmol/L (ref 22–32)
Calcium: 9.2 mg/dL (ref 8.9–10.3)
Chloride: 105 mmol/L (ref 98–111)
Creatinine, Ser: 1.14 mg/dL — ABNORMAL HIGH (ref 0.44–1.00)
GFR, Estimated: 57 mL/min — ABNORMAL LOW (ref >60)
Glucose, Bld: 110 mg/dL — ABNORMAL HIGH (ref 70–99)
Potassium: 4.5 mmol/L (ref 3.5–5.1)
Sodium: 137 mmol/L (ref 135–145)

## 2023-07-17 LAB — CBC
HCT: 38.8 % (ref 36.0–46.0)
Hemoglobin: 12.5 g/dL (ref 12.0–15.0)
MCH: 29.9 pg (ref 26.0–34.0)
MCHC: 32.2 g/dL (ref 30.0–36.0)
MCV: 92.8 fL (ref 80.0–100.0)
Platelets: 318 10*3/uL (ref 150–400)
RBC: 4.18 MIL/uL (ref 3.87–5.11)
RDW: 13.2 % (ref 11.5–15.5)
WBC: 9.4 10*3/uL (ref 4.0–10.5)
nRBC: 0 % (ref 0.0–0.2)

## 2023-07-17 LAB — TROPONIN I (HIGH SENSITIVITY): Troponin I (High Sensitivity): 44 ng/L — ABNORMAL HIGH (ref <18)

## 2023-07-17 MED ORDER — ASPIRIN 81 MG PO CHEW
324.0000 mg | CHEWABLE_TABLET | Freq: Once | ORAL | Status: DC
  Filled 2023-07-17: qty 4

## 2023-07-17 NOTE — ED Triage Notes (Signed)
Patient with burning to the chest that radiates to the mid back. States she feels [facility] when the burning occurs. Denies nvd. Usually can take one nitroglycerin and it goes away but not tonight. States this really increased on Saturday. Really noticed it gets worse with exertion.

## 2023-07-17 NOTE — ED Provider Notes (Signed)
[place] EMERGENCY DEPARTMENT AT [facility] Provider Note   CSN: [id number] Arrival date & time: 07/17/23  2138     History {Add pertinent medical, surgical, social history, OB history to HPI:1} Chief Complaint  Patient presents with   Chest Pain    Kelsey Padilla is a 57 y.o. female.  Patient with a history of CAD status post CABG, status post stent presenting with intermittent chest "burning, pain and pressure.  Pain has been coming and going for the better part of 2 months on an every other day basis.  States she been taking nitroglycerin about every other day.  Pain last for several minutes at a time and improved with nitroglycerin.  The pain comes on when she exerts herself and bends over.  Associate with shortness of breath and nausea.  No vomiting.  No cough, runny nose, sore throat, abdominal pain, diarrhea.  Did not improve with nitroglycerin tonight so she decided come in.  Does not have any pain currently.  She has not seen a cardiologist for several years.  Still takes Brilinta. Last saw Dr. Allyson Sabal in 2019.  Patient denies any chest pain currently.  She states the pain comes on when she bends over and when she exerts herself and is associate with shortness of breath.  Pain radiates to her mid back.  The history is provided by the patient.  Chest Pain Associated symptoms: back pain and shortness of breath   Associated symptoms: no abdominal pain, no dizziness, no fever, no headache, no nausea, no vomiting and no weakness        Home Medications Prior to Admission medications   Medication Sig Start Date End Date Taking? Authorizing Provider  acetaminophen (TYLENOL) 650 MG CR tablet Take 650 mg by mouth every 8 (eight) hours as needed for pain.    [provider]  aspirin 81 MG chewable tablet Chew 1 tablet (81 mg total) by mouth daily. 03/07/18   Arty Baumgartner, NP  Cyanocobalamin (VITAMIN B-12) 500 MCG LOZG Take 1 lozenge by mouth once a week.      [provider]  diphenhydrAMINE (BENADRYL) 25 MG tablet Take 25 mg by mouth every 6 (six) hours as needed for allergies.    [provider]  ezetimibe (ZETIA) 10 MG tablet TAKE 1 TABLET BY MOUTH DAILY 08/20/18   Antoine Poche, MD  metoprolol tartrate (LOPRESSOR) 25 MG tablet Take 0.5 tablets (12.5 mg total) by mouth 2 (two) times daily. 05/26/18 08/24/18  Antoine Poche, MD  nitroGLYCERIN (NITROSTAT) 0.4 MG SL tablet Place 1 tablet (0.4 mg total) under the tongue every 5 (five) minutes as needed. 03/06/18   Arty Baumgartner, NP  simvastatin (ZOCOR) 40 MG tablet TAKE 1 TABLET BY MOUTH EVERY DAY 07/02/18   Arty Baumgartner, NP  ticagrelor (BRILINTA) 90 MG TABS tablet Take 1 tablet (90 mg total) by mouth 2 (two) times daily. 03/06/18   Arty Baumgartner, NP  traMADol (ULTRAM) 50 MG tablet Take 1 tablet (50 mg total) by mouth every 6 (six) hours as needed. 07/20/18   Raeford Razor, MD      Allergies    Codeine and Statins    Review of Systems   Review of Systems  Constitutional:  Negative for activity change, appetite change and fever.  HENT:  Negative for congestion.   Respiratory:  Positive for chest tightness and shortness of breath.   Cardiovascular:  Positive for chest pain.  Gastrointestinal:  Negative for  abdominal pain, nausea and vomiting.  Genitourinary:  Negative for dysuria and hematuria.  Musculoskeletal:  Positive for back pain. Negative for arthralgias.  Skin:  Negative for rash.  Neurological:  Negative for dizziness, weakness and headaches.    all other systems are negative except as noted in the HPI and PMH.   Physical Exam Updated Vital Signs BP 127/84 (BP Location: Right Arm)   Pulse 70   Temp 97.9 F (36.6 C) (Oral)   Resp 19   Ht 5\' 3"  (1.6 m)   Wt 70 kg   SpO2 100%   BMI 27.34 kg/m  Physical Exam Vitals and nursing note reviewed.  Constitutional:      General: She is not in acute distress.    Appearance: She is well-developed.   HENT:     Head: Normocephalic and atraumatic.     Mouth/Throat:     Pharynx: No oropharyngeal exudate.  Eyes:     Conjunctiva/sclera: Conjunctivae normal.     Pupils: Pupils are equal, round, and reactive to light.  Neck:     Comments: No meningismus. Cardiovascular:     Rate and Rhythm: Normal rate and regular rhythm.     Heart sounds: Normal heart sounds. No murmur heard. Pulmonary:     Effort: Pulmonary effort is normal. No respiratory distress.     Breath sounds: Normal breath sounds.  Chest:     Chest wall: No tenderness.  Abdominal:     Palpations: Abdomen is soft.     Tenderness: There is no abdominal tenderness. There is no guarding or rebound.  Musculoskeletal:        General: No tenderness. Normal range of motion.     Cervical back: Normal range of motion and neck supple.  Skin:    General: Skin is warm.  Neurological:     General: No focal deficit present.     Mental Status: She is alert and oriented to person, place, and time. Mental status is at baseline.     Cranial Nerves: No cranial nerve deficit.     Motor: No abnormal muscle tone.     Coordination: Coordination normal.     Comments:  5/5 strength throughout. CN 2-12 intact.Equal grip strength.   Psychiatric:        Behavior: Behavior normal.     ED Results / Procedures / Treatments   Labs (all labs ordered are listed, but only abnormal results are displayed) Labs Reviewed  BASIC METABOLIC PANEL - Abnormal; Notable for the following components:      Result Value   Glucose, Bld 110 (*)    Creatinine, Ser 1.14 (*)    GFR, Estimated 57 (*)    All other components within normal limits  TROPONIN I (HIGH SENSITIVITY) - Abnormal; Notable for the following components:   Troponin I (High Sensitivity) 44 (*)    All other components within normal limits  CBC    EKG EKG Interpretation Date/Time:  Wednesday July 17 2023 22:39:22 EDT Ventricular Rate:  77 PR Interval:  168 QRS Duration:  102 QT  Interval:  420 QTC Calculation: 475 R Axis:   45  Text Interpretation: Normal sinus rhythm Inferior infarct , age undetermined Anterolateral infarct , age undetermined Abnormal ECG When compared with ECG of 20-Jul-2018 12:03, PREVIOUS ECG IS PRESENT No significant change was found Confirmed by Glynn Octave [phone]) on 07/17/2023 11:32:36 PM  Radiology DG Chest 2 View  Result Date: 07/17/2023 CLINICAL DATA:  Chest pain EXAM: CHEST - 2 VIEW  COMPARISON:  Chest x-ray 07/20/2018 FINDINGS: Sternotomy wires are again seen. The heart size and mediastinal contours are within normal limits. Both lungs are clear. The visualized skeletal structures are unremarkable. IMPRESSION: No active cardiopulmonary disease. Electronically Signed   By: Darliss Cheney M.D.   On: 07/17/2023 23:29    Procedures Procedures  {Document cardiac monitor, telemetry assessment procedure when appropriate:1}  Medications Ordered in ED Medications  aspirin chewable tablet 324 mg (has no administration in time range)    ED Course/ Medical Decision Making/ A&P   {   Click here for ABCD2, HEART and other calculatorsREFRESH Note before signing :1}                              Medical Decision Making Amount and/or Complexity of Data Reviewed Labs: ordered. Decision-making details documented in ED Course. Radiology: ordered and independent interpretation performed. Decision-making details documented in ED Course. ECG/medicine tests: ordered and independent interpretation performed. Decision-making details documented in ED Course.  Risk OTC drugs.   Patient here with intermittent chest burning or shortness of breath rating to the mid back.  Symptoms ongoing for the better part of 2 months taking nitroglycerin every other day basis.  Severe episode of pain today that we did not improve with nitroglycerin but is now chest pain-free.  EKG shows no acute ST changes.  Troponin minimally elevated at 44.  She is given aspirin.   Remains chest pain-free.  Last catheterization in 2019 showed diffuse disease of her bypass vessels and 1 stent was placed.  {Document critical care time when appropriate:1} {Document review of labs and clinical decision tools ie heart score, Chads2Vasc2 etc:1}  {Document your independent review of radiology images, and any outside records:1} {Document your discussion with family members, caretakers, and with consultants:1} {Document social determinants of health affecting pt's care:1} {Document your decision making why or why not admission, treatments were needed:1} Final Clinical Impression(s) / ED Diagnoses Final diagnoses:  None    Rx / DC Orders ED Discharge Orders     None

## 2023-07-18 ENCOUNTER — Inpatient Hospital Stay (HOSPITAL_COMMUNITY)
Admission: EM | Disposition: A | Discharge status: Home / Self Care | Payer: Self-pay | Source: Home / Self Care | Attending: Internal Medicine

## 2023-07-18 ENCOUNTER — Inpatient Hospital Stay (HOSPITAL_COMMUNITY): Payer: Medicaid Other

## 2023-07-18 DIAGNOSIS — Z7984 Long term (current) use of oral hypoglycemic drugs: Secondary | ICD-10-CM | POA: Diagnosis not present

## 2023-07-18 DIAGNOSIS — I214 Non-ST elevation (NSTEMI) myocardial infarction: Secondary | ICD-10-CM | POA: Diagnosis not present

## 2023-07-18 DIAGNOSIS — Z885 Allergy status to narcotic agent status: Secondary | ICD-10-CM | POA: Diagnosis not present

## 2023-07-18 DIAGNOSIS — Z597 Insufficient social insurance and welfare support: Secondary | ICD-10-CM | POA: Diagnosis not present

## 2023-07-18 DIAGNOSIS — E119 Type 2 diabetes mellitus without complications: Secondary | ICD-10-CM | POA: Diagnosis not present

## 2023-07-18 DIAGNOSIS — R079 Chest pain, unspecified: Secondary | ICD-10-CM | POA: Diagnosis not present

## 2023-07-18 DIAGNOSIS — I255 Ischemic cardiomyopathy: Secondary | ICD-10-CM | POA: Diagnosis not present

## 2023-07-18 DIAGNOSIS — I25710 Atherosclerosis of autologous vein coronary artery bypass graft(s) with unstable angina pectoris: Secondary | ICD-10-CM | POA: Diagnosis not present

## 2023-07-18 DIAGNOSIS — E785 Hyperlipidemia, unspecified: Secondary | ICD-10-CM | POA: Diagnosis not present

## 2023-07-18 DIAGNOSIS — I25110 Atherosclerotic heart disease of native coronary artery with unstable angina pectoris: Secondary | ICD-10-CM | POA: Diagnosis not present

## 2023-07-18 DIAGNOSIS — Z818 Family history of other mental and behavioral disorders: Secondary | ICD-10-CM | POA: Diagnosis not present

## 2023-07-18 DIAGNOSIS — I5022 Chronic systolic (congestive) heart failure: Secondary | ICD-10-CM | POA: Diagnosis not present

## 2023-07-18 DIAGNOSIS — Z79899 Other long term (current) drug therapy: Secondary | ICD-10-CM | POA: Diagnosis not present

## 2023-07-18 DIAGNOSIS — Z833 Family history of diabetes mellitus: Secondary | ICD-10-CM | POA: Diagnosis not present

## 2023-07-18 DIAGNOSIS — Z7902 Long term (current) use of antithrombotics/antiplatelets: Secondary | ICD-10-CM | POA: Diagnosis not present

## 2023-07-18 DIAGNOSIS — I2510 Atherosclerotic heart disease of native coronary artery without angina pectoris: Secondary | ICD-10-CM

## 2023-07-18 DIAGNOSIS — K047 Periapical abscess without sinus: Secondary | ICD-10-CM | POA: Diagnosis present

## 2023-07-18 DIAGNOSIS — Z882 Allergy status to sulfonamides status: Secondary | ICD-10-CM | POA: Diagnosis not present

## 2023-07-18 DIAGNOSIS — I110 Hypertensive heart disease with heart failure: Secondary | ICD-10-CM | POA: Diagnosis not present

## 2023-07-18 DIAGNOSIS — Z955 Presence of coronary angioplasty implant and graft: Secondary | ICD-10-CM | POA: Diagnosis not present

## 2023-07-18 DIAGNOSIS — F17210 Nicotine dependence, cigarettes, uncomplicated: Secondary | ICD-10-CM | POA: Diagnosis not present

## 2023-07-18 DIAGNOSIS — Z8249 Family history of ischemic heart disease and other diseases of the circulatory system: Secondary | ICD-10-CM | POA: Diagnosis not present

## 2023-07-18 DIAGNOSIS — I252 Old myocardial infarction: Secondary | ICD-10-CM | POA: Diagnosis not present

## 2023-07-18 DIAGNOSIS — Z7982 Long term (current) use of aspirin: Secondary | ICD-10-CM | POA: Diagnosis not present

## 2023-07-18 HISTORY — PX: LEFT HEART CATH AND CORS/GRAFTS ANGIOGRAPHY: CATH118250

## 2023-07-18 HISTORY — PX: CORONARY LITHOTRIPSY: CATH118330

## 2023-07-18 HISTORY — PX: CORONARY STENT INTERVENTION: CATH118234

## 2023-07-18 LAB — BASIC METABOLIC PANEL
Anion gap: 12 (ref 5–15)
BUN: 14 mg/dL (ref 6–20)
CO2: 20 mmol/L — ABNORMAL LOW (ref 22–32)
Calcium: 9.1 mg/dL (ref 8.9–10.3)
Chloride: 105 mmol/L (ref 98–111)
Creatinine, Ser: 1.12 mg/dL — ABNORMAL HIGH (ref 0.44–1.00)
GFR, Estimated: 58 mL/min — ABNORMAL LOW (ref >60)
Glucose, Bld: 118 mg/dL — ABNORMAL HIGH (ref 70–99)
Potassium: 4 mmol/L (ref 3.5–5.1)
Sodium: 137 mmol/L (ref 135–145)

## 2023-07-18 LAB — ECHOCARDIOGRAM COMPLETE
Height: 63 in
Weight: 2469.15 oz

## 2023-07-18 LAB — LIPID PANEL
Cholesterol: 237 mg/dL — ABNORMAL HIGH (ref 0–200)
HDL: 44 mg/dL (ref >40)
LDL Cholesterol: 173 mg/dL — ABNORMAL HIGH (ref 0–99)
Total CHOL/HDL Ratio: 5.4 RATIO
Triglycerides: 102 mg/dL (ref <150)
VLDL: 20 mg/dL (ref 0–40)

## 2023-07-18 LAB — CBC
HCT: 38 % (ref 36.0–46.0)
Hemoglobin: 12.3 g/dL (ref 12.0–15.0)
MCH: 30 pg (ref 26.0–34.0)
MCHC: 32.4 g/dL (ref 30.0–36.0)
MCV: 92.7 fL (ref 80.0–100.0)
Platelets: 319 10*3/uL (ref 150–400)
RBC: 4.1 MIL/uL (ref 3.87–5.11)
RDW: 13.2 % (ref 11.5–15.5)
WBC: 9 10*3/uL (ref 4.0–10.5)
nRBC: 0 % (ref 0.0–0.2)

## 2023-07-18 LAB — HIV ANTIBODY (ROUTINE TESTING W REFLEX): HIV Screen 4th Generation wRfx: NONREACTIVE

## 2023-07-18 LAB — POCT ACTIVATED CLOTTING TIME
Activated Clotting Time: 189 s
Activated Clotting Time: 195 s
Activated Clotting Time: 220 s
Activated Clotting Time: 256 s
Activated Clotting Time: 287 seconds

## 2023-07-18 LAB — TROPONIN I (HIGH SENSITIVITY)
Troponin I (High Sensitivity): 45 ng/L — ABNORMAL HIGH (ref <18)
Troponin I (High Sensitivity): 54 ng/L — ABNORMAL HIGH (ref <18)

## 2023-07-18 LAB — HEPARIN LEVEL (UNFRACTIONATED): Heparin Unfractionated: 0.19 IU/mL — ABNORMAL LOW (ref 0.30–0.70)

## 2023-07-18 SURGERY — LEFT HEART CATH AND CORS/GRAFTS ANGIOGRAPHY
Anesthesia: LOCAL

## 2023-07-18 MED ORDER — HEPARIN BOLUS VIA INFUSION
1000.0000 [IU] | Freq: Once | INTRAVENOUS | Status: AC
  Administered 2023-07-18: 1000 [IU] via INTRAVENOUS
  Filled 2023-07-18: qty 1000

## 2023-07-18 MED ORDER — TICAGRELOR 90 MG PO TABS
90.0000 mg | ORAL_TABLET | Freq: Two times a day (BID) | ORAL | Status: DC
  Administered 2023-07-18 – 2023-07-19 (×3): 90 mg via ORAL
  Filled 2023-07-18 (×4): qty 1

## 2023-07-18 MED ORDER — HEPARIN SODIUM (PORCINE) 1000 UNIT/ML IJ SOLN
INTRAMUSCULAR | Status: AC
  Filled 2023-07-18: qty 10

## 2023-07-18 MED ORDER — SODIUM CHLORIDE 0.9 % IV SOLN: INTRAVENOUS | Status: DC

## 2023-07-18 MED ORDER — SODIUM CHLORIDE 0.9 % IV SOLN: INTRAVENOUS | Status: AC

## 2023-07-18 MED ORDER — ACETAMINOPHEN 325 MG PO TABS: 650.0000 mg | ORAL_TABLET | ORAL | Status: DC | PRN

## 2023-07-18 MED ORDER — SIMVASTATIN 20 MG PO TABS
40.0000 mg | ORAL_TABLET | Freq: Every day | ORAL | Status: DC
  Administered 2023-07-18: 40 mg via ORAL
  Filled 2023-07-18: qty 2

## 2023-07-18 MED ORDER — MIDAZOLAM HCL 2 MG/2ML IJ SOLN
INTRAMUSCULAR | Status: DC | PRN
  Administered 2023-07-18 (×3): 1 mg via INTRAVENOUS

## 2023-07-18 MED ORDER — TICAGRELOR 90 MG PO TABS
ORAL_TABLET | ORAL | Status: DC | PRN
  Administered 2023-07-18: 90 mg via ORAL

## 2023-07-18 MED ORDER — ASPIRIN 81 MG PO CHEW
CHEWABLE_TABLET | ORAL | Status: AC
  Administered 2023-07-18: 324 mg via ORAL
  Filled 2023-07-18: qty 4

## 2023-07-18 MED ORDER — HEPARIN BOLUS VIA INFUSION
4000.0000 [IU] | Freq: Once | INTRAVENOUS | Status: AC
  Administered 2023-07-18: 4000 [IU] via INTRAVENOUS
  Filled 2023-07-18: qty 4000

## 2023-07-18 MED ORDER — TICAGRELOR 90 MG PO TABS: 90.0000 mg | ORAL_TABLET | Freq: Two times a day (BID) | ORAL | Status: DC

## 2023-07-18 MED ORDER — MIDAZOLAM HCL 2 MG/2ML IJ SOLN
INTRAMUSCULAR | Status: AC
  Filled 2023-07-18: qty 2

## 2023-07-18 MED ORDER — HEPARIN (PORCINE) 25000 UT/250ML-% IV SOLN
1150.0000 [IU]/h | INTRAVENOUS | Status: DC
  Administered 2023-07-18: 1000 [IU]/h via INTRAVENOUS
  Filled 2023-07-18: qty 250

## 2023-07-18 MED ORDER — HEPARIN (PORCINE) IN NACL 1000-0.9 UT/500ML-% IV SOLN
INTRAVENOUS | Status: DC | PRN
  Administered 2023-07-18 (×2): 500 mL

## 2023-07-18 MED ORDER — SODIUM CHLORIDE 0.9 % IV SOLN: 250.0000 mL | INTRAVENOUS | Status: DC | PRN

## 2023-07-18 MED ORDER — LIDOCAINE HCL (PF) 1 % IJ SOLN
INTRAMUSCULAR | Status: DC | PRN
  Administered 2023-07-18: 7 mL

## 2023-07-18 MED ORDER — TICAGRELOR 90 MG PO TABS
ORAL_TABLET | ORAL | Status: AC
  Filled 2023-07-18: qty 3

## 2023-07-18 MED ORDER — SODIUM CHLORIDE 0.9 % IV SOLN
INTRAVENOUS | Status: AC | PRN
  Administered 2023-07-18: 999 mL/h via INTRAVENOUS

## 2023-07-18 MED ORDER — IOHEXOL 350 MG/ML SOLN
INTRAVENOUS | Status: DC | PRN
  Administered 2023-07-18: 166 mL

## 2023-07-18 MED ORDER — HEPARIN SODIUM (PORCINE) 1000 UNIT/ML IJ SOLN
INTRAMUSCULAR | Status: DC | PRN
  Administered 2023-07-18: 4000 [IU] via INTRA_ARTERIAL
  Administered 2023-07-18: 2000 [IU] via INTRA_ARTERIAL
  Administered 2023-07-18: 3000 [IU] via INTRA_ARTERIAL
  Administered 2023-07-18: 1000 [IU] via INTRA_ARTERIAL

## 2023-07-18 MED ORDER — HYDRALAZINE HCL 20 MG/ML IJ SOLN: 10.0000 mg | INTRAMUSCULAR | Status: AC | PRN

## 2023-07-18 MED ORDER — ATROPINE SULFATE 1 MG/10ML IJ SOSY: PREFILLED_SYRINGE | INTRAMUSCULAR | Status: DC | PRN

## 2023-07-18 MED ORDER — NITROGLYCERIN 0.4 MG SL SUBL: 0.4000 mg | SUBLINGUAL_TABLET | SUBLINGUAL | Status: DC | PRN

## 2023-07-18 MED ORDER — ONDANSETRON HCL 4 MG/2ML IJ SOLN: 4.0000 mg | Freq: Four times a day (QID) | INTRAMUSCULAR | Status: DC | PRN

## 2023-07-18 MED ORDER — LIDOCAINE HCL (PF) 1 % IJ SOLN
INTRAMUSCULAR | Status: AC
  Filled 2023-07-18: qty 30

## 2023-07-18 MED ORDER — ASPIRIN 81 MG PO TBEC
81.0000 mg | DELAYED_RELEASE_TABLET | Freq: Every day | ORAL | Status: DC
  Administered 2023-07-19: 81 mg via ORAL
  Filled 2023-07-18: qty 1

## 2023-07-18 MED ORDER — FENTANYL CITRATE (PF) 100 MCG/2ML IJ SOLN
INTRAMUSCULAR | Status: AC
  Filled 2023-07-18: qty 2

## 2023-07-18 MED ORDER — SODIUM CHLORIDE 0.9% FLUSH: 3.0000 mL | INTRAVENOUS | Status: DC | PRN

## 2023-07-18 MED ORDER — SODIUM CHLORIDE 0.9% FLUSH
3.0000 mL | Freq: Two times a day (BID) | INTRAVENOUS | Status: DC
  Administered 2023-07-18 – 2023-07-19 (×2): 3 mL via INTRAVENOUS

## 2023-07-18 MED ORDER — METOPROLOL TARTRATE 12.5 MG HALF TABLET
12.5000 mg | ORAL_TABLET | Freq: Two times a day (BID) | ORAL | Status: DC
  Administered 2023-07-18: 12.5 mg via ORAL
  Filled 2023-07-18 (×4): qty 1

## 2023-07-18 MED ORDER — ASPIRIN 81 MG PO CHEW: 81.0000 mg | CHEWABLE_TABLET | Freq: Every day | ORAL | Status: DC

## 2023-07-18 MED ORDER — LABETALOL HCL 5 MG/ML IV SOLN: 10.0000 mg | INTRAVENOUS | Status: AC | PRN

## 2023-07-18 MED ORDER — FENTANYL CITRATE (PF) 100 MCG/2ML IJ SOLN
INTRAMUSCULAR | Status: DC | PRN
  Administered 2023-07-18 (×3): 25 ug via INTRAVENOUS

## 2023-07-18 SURGICAL SUPPLY — 30 items
BALLN EMERGE MR 2.0X12 (BALLOONS) ×1
BALLN WOLVERINE 2.00X10 (BALLOONS) ×1
BALLN WOLVERINE 2.50X10 (BALLOONS) ×1
BALLN ~~LOC~~ EMERGE MR 2.75X12 (BALLOONS) ×1
BALLOON EMERGE MR 2.0X12 (BALLOONS) IMPLANT
BALLOON WOLVERINE 2.00X10 (BALLOONS) IMPLANT
BALLOON WOLVERINE 2.50X10 (BALLOONS) IMPLANT
BALLOON ~~LOC~~ EMERGE MR 2.75X12 (BALLOONS) IMPLANT
CATH INFINITI 5FR MULTPACK ANG (CATHETERS) IMPLANT
CATH LAUNCHER 6FR EBU3.5 (CATHETERS) IMPLANT
CATH SHOCKWAVE C2 2.5X12 (CATHETERS) IMPLANT
CATH TELEPORT (CATHETERS) IMPLANT
CLOSURE PERCLOSE PROSTYLE (VASCULAR PRODUCTS) IMPLANT
ELECT DEFIB PAD ADLT CADENCE (PAD) IMPLANT
GUIDEWIRE VAS SION BLUE 190 (WIRE) IMPLANT
KIT ENCORE 26 ADVANTAGE (KITS) IMPLANT
PACK CARDIAC CATHETERIZATION (CUSTOM PROCEDURE TRAY) ×2 IMPLANT
PROTECTION STATION PRESSURIZED (MISCELLANEOUS) ×1
SET ATX-X65L (MISCELLANEOUS) IMPLANT
SHEATH PINNACLE 5F 10CM (SHEATH) IMPLANT
SHEATH PINNACLE 6F 10CM (SHEATH) IMPLANT
SHEATH PROBE COVER 6X72 (BAG) IMPLANT
STATION PROTECTION PRESSURIZED (MISCELLANEOUS) IMPLANT
STENT SYNERGY XD 2.50X16 (Permanent Stent) IMPLANT
SYNERGY XD 2.50X16 (Permanent Stent) ×1 IMPLANT
TUBING CIL FLEX 10 FLL-RA (TUBING) ×2 IMPLANT
WIRE ASAHI PROWATER 300CM (WIRE) IMPLANT
WIRE CHOICE GRAPHX PT 300 (WIRE) IMPLANT
WIRE EMERALD 3MM-J .035X260CM (WIRE) IMPLANT
WIRE MICRO SET SILHO 5FR 7 (SHEATH) IMPLANT

## 2023-07-18 NOTE — Progress Notes (Signed)
Soaked dressing on cath site. Site is soft, no hematoma. Denied back pain. Applied manual pressure. Changed dressing. Assessed with CN Dana. Plan of care ongoing.

## 2023-07-18 NOTE — Progress Notes (Signed)
ANTICOAGULATION CONSULT NOTE - Initial Consult  Pharmacy Consult for Heparin Indication: chest pain/ACS  Allergies  Allergen Reactions   Codeine Nausea And Vomiting   Statins     Patient says cannot tolerate statins d/t muscle cramping.  She is taking Simvastatin 40mg  PTA, reports she can tolerate simvastatin better.     Patient Measurements: Height: 5\' 3"  (160 cm) Weight: 70 kg (154 lb 5.2 oz) IBW/kg (Calculated) : 52.4 Heparin Dosing Weight: 67 kg  Vital Signs: Temp: 97.9 F (36.6 C) (07/31 2345) Temp Source: Oral (07/31 2345) BP: 127/84 (07/31 2345) Pulse Rate: 70 (07/31 2345)  Labs: Recent Labs    07/17/23 2235  HGB 12.5  HCT 38.8  PLT 318  CREATININE 1.14*  TROPONINIHS 44*    Estimated Creatinine Clearance: 52.3 mL/min (A) (by C-G formula based on SCr of 1.14 mg/dL (H)).   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 LIMA to LAD, SVG to OM, SVG to RPDA   Tobacco abuse    Vitamin B12 deficiency      Assessment: 57 yo W with hx CABG in 2012 and new persistent chest pain for 2 months despite nitroglycerin. No anticoagulation prior to admission. Pharmacy consulted for heparin.      Goal of Therapy:  Heparin level 0.3-0.7 units/ml Monitor platelets by anticoagulation protocol: Yes   Plan:  Heparin 4000 unit bolus then infusion at 1000 units/hr F/u 6hr heparin level Monitor daily heparin level, CBC, signs/symptoms of bleeding   Alphia Moh, PharmD, BCPS, [facility] Clinical Pharmacist  Please check AMION for all [facility] Pharmacy phone numbers After 10:00 PM, call Main Pharmacy [phone]

## 2023-07-18 NOTE — Interval H&P Note (Signed)
History and Physical Interval Note:  07/18/2023 11:51 AM  Kelsey Padilla  has presented today for surgery, with the diagnosis of non-stemi.  The various methods of treatment have been discussed with the patient and family. After consideration of risks, benefits and other options for treatment, the patient has consented to  Procedure(s): LEFT HEART CATH AND CORS/GRAFTS ANGIOGRAPHY (N/A) as a surgical intervention.  The patient's history has been reviewed, patient examined, no change in status, stable for surgery.  I have reviewed the patient's chart and labs.  Questions were answered to the patient's satisfaction.    Cath Lab Visit (complete for each Cath Lab visit)  Clinical Evaluation Leading to the Procedure:   ACS: Yes.    Non-ACS:    Anginal Classification: CCS III  Anti-ischemic medical therapy: Maximal Therapy (2 or more classes of medications)  Non-Invasive Test Results: No non-invasive testing performed  Prior CABG: Previous CABG        Orbie Pyo

## 2023-07-18 NOTE — H&P (Signed)
Cardiology Admission History and Physical   Patient ID: PANDORA BALCAZAR MRN: [id number]; DOB: Dec 01, 1966   Admission date: 07/17/2023  PCP:  [facility]   [facility] Providers Cardiologist:  Dina Rich, MD        Chief Complaint:  Chest Pain  Patient Profile:   Kelsey Padilla is a 57 y.o. female with HTN, DMII, CAD s/p CABG (2012; LIMA-LAD, SVG-OM, SVG-RPDA) and subsequent PCI (2019; SVG to OM Dist Graft), active tobacco use, HFmrEF, hyperlipidemia, and a history of VitB12 deficiency  who is being seen 07/18/2023 for the evaluation of chest pain with overall picture concerning for unstable angina/ NSTEMI.   History of Present Illness:   Ms. Hanel has been experiencing episodes of discomfort for about two months, which have increased in frequency to every other day. On the day of presentation, she had multiple episodes which concerned her that she may be having a heart attack. The patient describes the episodes as being triggered by bending or squatting and is accompanied by a sensation of heat, which the patient attributes to going through the change of life. The patient also experiences shortness of breath more in the evening after being up for a long time. The patient has a history of coronary artery disease and a previous heart attack in 2019, which presented as burning in the throat without chest pain. The current episodes are described as burning in the middle of the chest, spreading to the back and around the back area, with a pain scale of 10 at its worst.  She had an episode of chest pain 5 days prior to admission that was bad enough that she had to call EMS.  EKG was performed not show dynamic changes and she elected not to go to the emergency room.  The patient also reports a tooth infection currently being treated with antibiotics. The patient smokes five to six cigarettes a day and denies alcohol and recreational drug use. The patient has an  autistic grandson who they care for and expresses a desire for resuscitative efforts if necessary.  At the time of my interview, patient states that she is comfortable and denies any active chest pain, exertional chest pressure/discomfort, dyspnea/tachypnea, paroxysmal nocturnal dyspnea/orthopnea, irregular heart beat/palpitations, presyncope/syncope, lower extremity edema, claudication, or abdominal distention. There are no clear localizing signs or symptoms of infection.  In the ED, initial vital signs showed a BP of 158/91, 2 700% on room air.  She was afebrile with a heart rate in the 80s. EKG was similar to prior EKGs which showed sinus rhythm, with inferior Q waves, no other dynamic ST changes.  Labs are notable for initial troponin of 44 which uptrended to 54.  Chest x-ray showed no active cardiopulmonary disease.    Past Medical History:  Diagnosis Date   Anemia, macrocytic    Community acquired pneumonia 03/01/2018   Coronary artery disease    Hyperlipidemia    S/P CABG x 3 08/16/2011   BARTLE. LIMA to LAD, SVG to OM, SVG to RPDA   Tobacco abuse    Vitamin B12 deficiency     Past Surgical History:  Procedure Laterality Date   CARDIAC CATHETERIZATION  07/2011   significnat distal left main and 3 vessel CAD. Normal EF.    CORONARY ARTERY BYPASS GRAFT     CORONARY STENT INTERVENTION N/A 02/28/2018   Procedure: CORONARY STENT INTERVENTION;  Surgeon: Corky Crafts, MD;  Location: [facility] INVASIVE CV LAB;  Service: Cardiovascular;  Laterality: N/A;   IABP INSERTION N/A 02/28/2018   Procedure: IABP Insertion;  Surgeon: Corky Crafts, MD;  Location: [facility] INVASIVE CV LAB;  Service: Cardiovascular;  Laterality: N/A;   LEFT HEART CATH AND CORONARY ANGIOGRAPHY N/A 02/28/2018   Procedure: LEFT HEART CATH AND CORONARY ANGIOGRAPHY;  Surgeon: Corky Crafts, MD;  Location: [facility] INVASIVE CV LAB;  Service: Cardiovascular;  Laterality: N/A;   LEFT HEART CATH AND CORS/GRAFTS ANGIOGRAPHY  N/A 02/28/2018   Procedure: LEFT HEART CATH AND CORS/GRAFTS ANGIOGRAPHY;  Surgeon: Corky Crafts, MD;  Location: [facility] INVASIVE CV LAB;  Service: Cardiovascular;  Laterality: N/A;   RIGHT HEART CATH N/A 02/28/2018   Procedure: RIGHT HEART CATH;  Surgeon: Corky Crafts, MD;  Location: [facility] INVASIVE CV LAB;  Service: Cardiovascular;  Laterality: N/A;     Medications Prior to Admission: Prior to Admission medications   Medication Sig Start Date End Date Taking? Authorizing Provider  acetaminophen (TYLENOL) 650 MG CR tablet Take 650 mg by mouth every 8 (eight) hours as needed for pain.    [provider]  aspirin 81 MG chewable tablet Chew 1 tablet (81 mg total) by mouth daily. 03/07/18   Arty Baumgartner, NP  Cyanocobalamin (VITAMIN B-12) 500 MCG LOZG Take 1 lozenge by mouth once a week.     [provider]  diphenhydrAMINE (BENADRYL) 25 MG tablet Take 25 mg by mouth every 6 (six) hours as needed for allergies.    [provider]  ezetimibe (ZETIA) 10 MG tablet TAKE 1 TABLET BY MOUTH DAILY 08/20/18   Antoine Poche, MD  metoprolol tartrate (LOPRESSOR) 25 MG tablet Take 0.5 tablets (12.5 mg total) by mouth 2 (two) times daily. 05/26/18 08/24/18  Antoine Poche, MD  nitroGLYCERIN (NITROSTAT) 0.4 MG SL tablet Place 1 tablet (0.4 mg total) under the tongue every 5 (five) minutes as needed. 03/06/18   Arty Baumgartner, NP  simvastatin (ZOCOR) 40 MG tablet TAKE 1 TABLET BY MOUTH EVERY DAY 07/02/18   Arty Baumgartner, NP  ticagrelor (BRILINTA) 90 MG TABS tablet Take 1 tablet (90 mg total) by mouth 2 (two) times daily. 03/06/18   Arty Baumgartner, NP  traMADol (ULTRAM) 50 MG tablet Take 1 tablet (50 mg total) by mouth every 6 (six) hours as needed. 07/20/18   Raeford Razor, MD     Allergies:    Allergies  Allergen Reactions   Codeine Nausea And Vomiting   Other Nausea And Vomiting    Artificial sweetner   Statins     Patient says cannot tolerate statins d/t  muscle cramping.  She is taking Simvastatin 40mg  PTA, reports she can tolerate simvastatin better.     Social History:   Social History   Socioeconomic History   Marital status: Married    Spouse name: Not on file   Number of children: Not on file   Years of education: Not on file   Highest education level: Not on file  Occupational History   Not on file  Tobacco Use   Smoking status: Some Days    Current packs/day: 0.00    Average packs/day: 0.3 packs/day for 20.0 years (5.0 ttl pk-yrs)    Types: Cigarettes    Start date: 08/16/1991    Last attempt to quit: 08/16/2011    Years since quitting: 11.9   Smokeless tobacco: Never  Vaping Use   Vaping status: Never Used  Substance and Sexual Activity   Alcohol use: No   Drug use:  No   Sexual activity: Not on file  Other Topics Concern   Not on file  Social History Narrative   Has 71 year old daughter and 45 year old son   Has 1 grandchild 46 mths old   Social Determinants of Corporate investment banker Strain: Not on file  Food Insecurity: Not on file  Transportation Needs: Not on file  Physical Activity: Not on file  Stress: Not on file  Social Connections: Not on file  Intimate Partner Violence: Not on file    Family History:   The patient's family history includes Cancer in her father and mother; Diabetes in her mother; Heart disease in her mother; Heart failure in her mother; High blood pressure in her father.    ROS:  Please see the history of present illness.  All other ROS reviewed and negative.     Physical Exam/Data:   Vitals:   07/18/23 0020 07/18/23 0030 07/18/23 0100 07/18/23 0115  BP: (!) 172/99 (!) 157/94 (!) 133/90 (!) 120/91  Pulse: 86 80 77 75  Resp: 17 18 (!) 22 20  Temp:      TempSrc:      SpO2: 99% 100% 100% 100%  Weight:      Height:       No intake or output data in the 24 hours ending 07/18/23 0129    07/17/2023   10:30 PM 07/20/2018   12:04 PM 05/26/2018    1:56 PM  Last 3 Weights   Weight (lbs) 154 lb 5.2 oz 155 lb 155 lb  Weight (kg) 70 kg 70.308 kg 70.308 kg     Body mass index is 27.34 kg/m.  General:  Well nourished, well developed, in no acute distress HEENT: normal Neck: no JVD Cardiac:  normal S1, S2; RRR; no murmur/rubs/gallops Lungs:  clear to auscultation bilaterally, no wheezing, rhonchi or rales  Abd: soft, nontender, no hepatomegaly  Ext: no edema Musculoskeletal:  No deformities, BUE and BLE strength normal and equal Skin: warm and dry  Neuro:  No focal abnormalities noted Psych:  Normal affect      Relevant CV Studies: Echocardiogram 03/02/18: Study Conclusions   - Left ventricle: The cavity size was normal. Systolic function was   mildly reduced. The estimated ejection fraction was in the range   of 45% to 50%. Hypokinesis of the inferior myocardium. - Mitral valve: There was mild regurgitation.   Left heart catheterization 02/28/18: Conclusion    Prox RCA to Mid RCA lesion is 75% stenosed. Dist RCA lesion is 100% stenosed. SVG to PDA is occluded. Mid LM to Ost LAD lesion is 75% stenosed. Prox LAD to Mid LAD lesion is 100% stenosed. LIMA to LAD. Origin to Prox Graft lesion is 40% stenosed. Ost 2nd Mrg lesion is 100% stenosed. SVG to OM Dist Graft to Insertion lesion is 100% stenosed. This is the culprit. A drug-eluting stent was successfully placed using a STENT SYNERGY DES 3.5X38. Post intervention, there is a 0% residual stenosis. However, microvascular occlusion will cause ongoing injury. The left ventricular ejection fraction is 25-35% by visual estimate. There is moderate to severe left ventricular systolic dysfunction. LV end diastolic pressure is moderately elevated. There is no aortic valve stenosis. Ao sat 98%, PA sat 61%, PA mean 18 mm Hg; PCWP 17 mm Hg IABP placed   Decreased EF increased LVEDP.  BP stable.  IABP placed given ongoing ischemia due to no reflow.     DAPT for 1 year.  She will need aggressive secondary  prevention and CHF management.     Laboratory Data:  High Sensitivity Troponin:   Recent Labs  Lab 07/17/23 2235 07/18/23 0015  TROPONINIHS 44* 54*      Chemistry Recent Labs  Lab 07/17/23 2235  NA 137  K 4.5  CL 105  CO2 24  GLUCOSE 110*  BUN 14  CREATININE 1.14*  CALCIUM 9.2  GFRNONAA 57*  ANIONGAP 8    No results for input(s): "PROT", "ALBUMIN", "AST", "ALT", "ALKPHOS", "BILITOT" in the last 168 hours. Lipids No results for input(s): "CHOL", "TRIG", "HDL", "LABVLDL", "LDLCALC", "CHOLHDL" in the last 168 hours. Hematology Recent Labs  Lab 07/17/23 2235  WBC 9.4  RBC 4.18  HGB 12.5  HCT 38.8  MCV 92.8  MCH 29.9  MCHC 32.2  RDW 13.2  PLT 318   Thyroid No results for input(s): "TSH", "FREET4" in the last 168 hours. BNPNo results for input(s): "BNP", "PROBNP" in the last 168 hours.  DDimer  Recent Labs  Lab 07/18/23 0015  DDIMER 0.31     Radiology/Studies:  DG Chest 2 View  Result Date: 07/17/2023 CLINICAL DATA:  Chest pain EXAM: CHEST - 2 VIEW COMPARISON:  Chest x-ray 07/20/2018 FINDINGS: Sternotomy wires are again seen. The heart size and mediastinal contours are within normal limits. Both lungs are clear. The visualized skeletal structures are unremarkable. IMPRESSION: No active cardiopulmonary disease. Electronically Signed   By: Darliss Cheney M.D.   On: 07/17/2023 23:29     Assessment and Plan:  Kelsey Padilla is a 57 y.o. female with HTN, DMII, CAD s/p CABG (2012; LIMA-LAD, SVG-OM, SVG-RPDA) and subsequent PCI (2019; SVG to OM Dist Graft), active tobacco use, HFmrEF, hyperlipidemia, and a history of VitB12 deficiency  who is being seen 07/18/2023 for the evaluation of chest pain with overall picture concerning for unstable angina/ NSTEMI.   # UA/NSTEMI # CAD s/p CABG and prior PCI # HTN # Active Tobacco Use - Plan for diagnostic LHC in AM  - Patient to be NPO except for meds - Start heparin IV infusion protocol - Symptom management with SLNTG  prn - Trend trop and serial ECGs - TTE in AM - Patient has been on DAPT since her PCI in 2019, will continue for now with ASA and Ticagrelor - Continued aggressive risk factor modification with glycemic control, smoking cessation, and GDMT              - Lipid panel and A1c pending  # HLD :: Previously on simvastatin and Zetia.  Patient reports she is no longer taking Zetia.  In review of the chart it seems that patient was previously on atorvastatin, but this was discontinued due to muscle cramping - Will continue simvastatin for now, but will have to discuss with patient need for higher intensity statin therapy and her ability to tolerate it.  #DMII  - Hold metformin - Check A1c as above  # HFmrEF - TTE as above - Monitor strict I&O's and daily weights  Risk Assessment/Risk Scores:    TIMI Risk Score for Unstable Angina or Non-ST Elevation MI:   The patient's TIMI risk score is 4, which indicates a 20% risk of all cause mortality, new or recurrent myocardial infarction or need for urgent revascularization in the next 14 days.      Code Status: Full Code  Severity of Illness: The appropriate patient status for this patient is INPATIENT. Inpatient status is judged to be reasonable and necessary in  order to provide the required intensity of service to ensure the patient's safety. The patient's presenting symptoms, physical exam findings, and initial radiographic and laboratory data in the context of their chronic comorbidities is felt to place them at high risk for further clinical deterioration. Furthermore, it is not anticipated that the patient will be medically stable for discharge from the hospital within 2 midnights of admission.   * I certify that at the point of admission it is my clinical judgment that the patient will require inpatient hospital care spanning beyond 2 midnights from the point of admission due to high intensity of service, high risk for further deterioration  and high frequency of surveillance required.*   For questions or updates, please contact [facility] Please consult [email or web address] for contact info under     Signed, Ralene Ok, MD  07/18/2023 1:29 AM

## 2023-07-18 NOTE — H&P (View-Only) (Signed)
Rounding Note    Patient Name: Kelsey Padilla Date of Encounter: 07/18/2023  [facility] Cardiologist: Dina Rich, MD   Subjective   No current complaints.  Has chest pain with she is squatting and generally with some level of exertion.  Inpatient Medications    Scheduled Meds:  [START ON 07/19/2023] aspirin EC  81 mg Oral Daily   metoprolol tartrate  12.5 mg Oral BID   simvastatin  40 mg Oral QHS   ticagrelor  90 mg Oral BID   Continuous Infusions:  heparin 1,150 Units/hr (07/18/23 0741)   PRN Meds: acetaminophen, nitroGLYCERIN, ondansetron (ZOFRAN) IV   Vital Signs    Vitals:   07/18/23 0345 07/18/23 0400 07/18/23 0618 07/18/23 0742  BP: 121/83 122/85 133/85   Pulse: 70 73 75   Resp: (!) 22 20 16    Temp:    99.2 F (37.3 C)  TempSrc:    Oral  SpO2: 99% 100% 100%   Weight:      Height:       No intake or output data in the 24 hours ending 07/18/23 0825    07/17/2023   10:30 PM 07/20/2018   12:04 PM 05/26/2018    1:56 PM  Last 3 Weights  Weight (lbs) 154 lb 5.2 oz 155 lb 155 lb  Weight (kg) 70 kg 70.308 kg 70.308 kg      Telemetry    Normal sinus rhythm heart rate 70s to 80s- Personally Reviewed  ECG    Normal sinus rhythm, inferior Q waves.  No acute ST-T wave changes- Personally Reviewed  Physical Exam   GEN: No acute distress.   Neck: No JVD Cardiac: RRR, no murmurs, rubs, or gallops.  Respiratory: Clear to auscultation bilaterally. GI: Soft, nontender, non-distended  MS: No edema; No deformity. Neuro:  Nonfocal  Psych: Normal affect   Labs    High Sensitivity Troponin:   Recent Labs  Lab 07/17/23 2235 07/18/23 0015 07/18/23 0615  TROPONINIHS 44* 54* 45*     Chemistry Recent Labs  Lab 07/17/23 2235 07/18/23 0118  NA 137 137  K 4.5 4.0  CL 105 105  CO2 24 20*  GLUCOSE 110* 118*  BUN 14 14  CREATININE 1.14* 1.12*  CALCIUM 9.2 9.1  GFRNONAA 57* 58*  ANIONGAP 8 12    Lipids  Recent Labs  Lab 07/18/23 0118   CHOL 237*  TRIG 102  HDL 44  LDLCALC 173*  CHOLHDL 5.4    Hematology Recent Labs  Lab 07/17/23 2235 07/18/23 0118  WBC 9.4 9.0  RBC 4.18 4.10  HGB 12.5 12.3  HCT 38.8 38.0  MCV 92.8 92.7  MCH 29.9 30.0  MCHC 32.2 32.4  RDW 13.2 13.2  PLT 318 319   Thyroid No results for input(s): "TSH", "FREET4" in the last 168 hours.  BNPNo results for input(s): "BNP", "PROBNP" in the last 168 hours.  DDimer  Recent Labs  Lab 07/18/23 0015  DDIMER 0.31     Radiology    DG Chest 2 View  Result Date: 07/17/2023 CLINICAL DATA:  Chest pain EXAM: CHEST - 2 VIEW COMPARISON:  Chest x-ray 07/20/2018 FINDINGS: Sternotomy wires are again seen. The heart size and mediastinal contours are within normal limits. Both lungs are clear. The visualized skeletal structures are unremarkable. IMPRESSION: No active cardiopulmonary disease. Electronically Signed   By: Darliss Cheney M.D.   On: 07/17/2023 23:29    Cardiac Studies   Echocardiogram 03/02/2018 Left ventricle: The cavity size  was normal. Systolic function was    mildly reduced. The estimated ejection fraction was in the range    of 45% to 50%. Hypokinesis of the inferior myocardium.  - Mitral valve: There was mild regurgitation.   -------------------------------------------------------------------  Labs, prior tests, procedures, and surgery:  Coronary artery bypass grafting.   -------------------------------------------------------------------  Study data:  Comparison was made to the study of 08/09/2011.  Study  status:  Routine.  Procedure:  Transthoracic echocardiography.  Image quality was fair.  Study completion:  There were no  complications.          Transthoracic echocardiography.  M-mode,  complete 2D, spectral Doppler, and color Doppler.  Birthdate:  Patient birthdate: 1966/11/04.  Age:  Patient is 57 yr old.  Sex:  Gender: female.    BMI: 28.4 kg/m^2.  Patient status:  Inpatient.  Study date:  Study date: 03/02/2018. Study  time: 09:07 AM.  Location:  ICU/CCU   -------------------------------------------------------------------   -------------------------------------------------------------------  Left ventricle:  The cavity size was normal. Systolic function was  mildly reduced. The estimated ejection fraction was in the range of  45% to 50%.  Regional wall motion abnormalities:   Hypokinesis of  the inferior myocardium.   -------------------------------------------------------------------  Aortic valve:   Trileaflet; normal thickness leaflets. Mobility was  not restricted.  Doppler:  Transvalvular velocity was within the  normal range. There was no stenosis. There was no regurgitation.    -------------------------------------------------------------------  Aorta: Aortic root: The aortic root was normal in size.   -------------------------------------------------------------------  Mitral valve:   Structurally normal valve.   Mobility was not  restricted.  Doppler:  Transvalvular velocity was within the normal  range. There was no evidence for stenosis. There was mild  regurgitation.    Peak gradient (D): 7 mm Hg.   -------------------------------------------------------------------  Left atrium:  The atrium was normal in size.   -------------------------------------------------------------------  Right ventricle:  The cavity size was normal. Wall thickness was  normal. Systolic function was normal.   -------------------------------------------------------------------  Pulmonic valve:    The valve appears to be grossly normal.  Doppler:  Transvalvular velocity was within the normal range. There  was no evidence for stenosis. There was mild regurgitation.   -------------------------------------------------------------------  Tricuspid valve:   Structurally normal valve.    Doppler:  Transvalvular velocity was within the normal range. There was mild  regurgitation.    -------------------------------------------------------------------  Right atrium:  The atrium was normal in size.   -------------------------------------------------------------------  Pericardium: There was no pericardial effusion.   -------------------------------------------------------------------  Systemic veins:  Inferior vena cava: The vessel was normal in size.   Patient Profile     57 y.o. female with HTN, DMII, CAD s/p CABG (2012; LIMA-LAD, SVG-OM, SVG-RPDA) and subsequent PCI (2019; SVG to OM Dist Graft), active tobacco use, chronic HFmrEF, hyperlipidemia, and a history of VitB12 deficiency.  She was admitted on 07/18/2023 for evaluation of NSTEMI.  Assessment & Plan    NSTEMI CAD s/p CABG (2012; LIMA-LAD, SVG-OM, SVG-RPDA) and subsequent PCI (2019; SVG to OM Dist Graft) Patient presenting with worsening complaints of somewhat atypical chest burning that radiates to her back/neck/left arm.  However, she did not present atypically with burning throat complaints when she had her CABG.  States this occurs with squatting but not with walking.  This has been going on for last 2 months however worsened in the last 1 week.  She is now taking nitroglycerin every other day when she has not needed it before.  Also has  several known risk factors for coronary artery disease and remains to be an active smoker.  EKG showing no acute ST-T wave changes.  Troponins mildly elevated and flat in the 40s and 50s. Unclear what else could be contributing to troponin elevation.  With current symptoms and risk factors, we will plan for cardiac catheterization for definitve evaluation. Will load with 324 mg of aspirin Previously on DAPT with aspirin and Brilinta 90 mg twice daily Continue Lopressor 12.5 mg twice daily an statin  Continue IV heparin  Patient patientInformed Consent   Shared Decision Making/Informed Consent{  The risks [stroke (1 in 1000), death (1 in 1000), kidney failure [usually  temporary] (1 in 500), bleeding (1 in 200), allergic reaction [possibly serious] (1 in 200)], benefits (diagnostic support and management of coronary artery disease) and alternatives of a cardiac catheterization were discussed in detail with Ms. Adolphus and she is willing to proceed.      Chronic HFmrEF Echocardiogram in 2019 showed EF 45 to 50%.  Will repeat echocardiogram today.  Patient has had no issues with shortness of breath or volume previously.  Will reassess and titrate medications as indicated.  Hyperlipidemia On simvastatin 40 mg.  LDL 173. Will transition to high intensity post catheterization.  Previous notes state that she had issues with myalgias on atorvastatin.  Type 2 diabetes Continue to hold metformin.  A1c pending  Nicotine dependence Currently smokes 5 to 6 cigarettes a day.  Reports a lot of recent stress, not interested in quitting at this time.  Counseling provided.  For questions or updates, please contact [facility] Please consult [email or web address] for contact info under        Signed, Abagail Kitchens, PA-C  07/18/2023, 8:25 AM     Patient seen and examined with Yvonna Alanis PA-C.  Agree as above, with the following exceptions and changes as noted below.  Patient is currently chest pain-free, admitted by the cardiology moonlighter overnight.  History of coronary artery bypass grafting with Dr. Laneta Simmers 2012, repeat cardiac catheterization 2019.  Describes a strong family history of coronary artery disease with premature CAD on both sides of her family, in addition to that she has poorly controlled risk factors including continued smoking and untreated hyperlipidemia.  She attributes this to lack of insurance though now she feels her insurance situation would support medication compliance gen: NAD, CV: RRR, no murmurs, Lungs: clear, Abd: soft, Extrem: Warm, well perfused, no edema, Neuro/Psych: alert and oriented x 3, normal mood and affect. All available labs,  radiology testing, previous records reviewed.   -Exertional chest discomfort described as a chest burning radiating to her back in her temples, concerning for NSTEMI, troponins mildly elevated but may be indicative of obstructive CAD.  Most recent cath in 2019 with occluded SVG to PDA, and 100% stenosis of SVG to OM distal graft felt to be culprit and stented at that time.  Per report LIMA to LAD seems patent.  Severe native disease.  We discussed that with current presentation of accelerating angina over the last 2 months and worse this week and continued risk factors that are uncontrolled, she warrants cardiac catheterization.  She is fully in support of this and is equally concerned.  Currently chest pain-free.  Telemetry with sinus rhythm, EKG demonstrates sinus rhythm, inferior infarct, presenting EKG with anterolateral Q waves as well.  INFORMED CONSENT: I have reviewed the risks, indications, and alternatives to cardiac catheterization, possible angioplasty, and stenting with the patient. Risks include but are  not limited to bleeding, infection, vascular injury, stroke, myocardial infarction, arrhythmia, kidney injury, radiation-related injury in the case of prolonged fluoroscopy use, emergency cardiac surgery, and death. The patient understands the risks of serious complication is 1-2 in 1000 with diagnostic cardiac cath and 1-2% or less with angioplasty/stenting.    Parke Poisson, MD 07/18/23 8:50 AM

## 2023-07-18 NOTE — Plan of Care (Signed)
  Problem: Education: Goal: Knowledge of General Education information will improve Description: Including pain rating scale, medication(s)/side effects and non-pharmacologic comfort measures Outcome: Progressing   Problem: Health Behavior/Discharge Planning: Goal: Ability to manage health-related needs will improve Outcome: Progressing   Problem: Clinical Measurements: Goal: Cardiovascular complication will be avoided Outcome: Progressing   Problem: Pain Managment: Goal: General experience of comfort will improve Outcome: Progressing   Problem: Safety: Goal: Ability to remain free from injury will improve Outcome: Progressing   Problem: Skin Integrity: Goal: Risk for impaired skin integrity will decrease Outcome: Progressing   

## 2023-07-18 NOTE — Progress Notes (Addendum)
Rounding Note    Patient Name: Kelsey Padilla Date of Encounter: 07/18/2023  [facility] Cardiologist: Dina Rich, MD   Subjective   No current complaints.  Has chest pain with she is squatting and generally with some level of exertion.  Inpatient Medications    Scheduled Meds:  [START ON 07/19/2023] aspirin EC  81 mg Oral Daily   metoprolol tartrate  12.5 mg Oral BID   simvastatin  40 mg Oral QHS   ticagrelor  90 mg Oral BID   Continuous Infusions:  heparin 1,150 Units/hr (07/18/23 0741)   PRN Meds: acetaminophen, nitroGLYCERIN, ondansetron (ZOFRAN) IV   Vital Signs    Vitals:   07/18/23 0345 07/18/23 0400 07/18/23 0618 07/18/23 0742  BP: 121/83 122/85 133/85   Pulse: 70 73 75   Resp: (!) 22 20 16    Temp:    99.2 F (37.3 C)  TempSrc:    Oral  SpO2: 99% 100% 100%   Weight:      Height:       No intake or output data in the 24 hours ending 07/18/23 0825    07/17/2023   10:30 PM 07/20/2018   12:04 PM 05/26/2018    1:56 PM  Last 3 Weights  Weight (lbs) 154 lb 5.2 oz 155 lb 155 lb  Weight (kg) 70 kg 70.308 kg 70.308 kg      Telemetry    Normal sinus rhythm heart rate 70s to 80s- Personally Reviewed  ECG    Normal sinus rhythm, inferior Q waves.  No acute ST-T wave changes- Personally Reviewed  Physical Exam   GEN: No acute distress.   Neck: No JVD Cardiac: RRR, no murmurs, rubs, or gallops.  Respiratory: Clear to auscultation bilaterally. GI: Soft, nontender, non-distended  MS: No edema; No deformity. Neuro:  Nonfocal  Psych: Normal affect   Labs    High Sensitivity Troponin:   Recent Labs  Lab 07/17/23 2235 07/18/23 0015 07/18/23 0615  TROPONINIHS 44* 54* 45*     Chemistry Recent Labs  Lab 07/17/23 2235 07/18/23 0118  NA 137 137  K 4.5 4.0  CL 105 105  CO2 24 20*  GLUCOSE 110* 118*  BUN 14 14  CREATININE 1.14* 1.12*  CALCIUM 9.2 9.1  GFRNONAA 57* 58*  ANIONGAP 8 12    Lipids  Recent Labs  Lab 07/18/23 0118   CHOL 237*  TRIG 102  HDL 44  LDLCALC 173*  CHOLHDL 5.4    Hematology Recent Labs  Lab 07/17/23 2235 07/18/23 0118  WBC 9.4 9.0  RBC 4.18 4.10  HGB 12.5 12.3  HCT 38.8 38.0  MCV 92.8 92.7  MCH 29.9 30.0  MCHC 32.2 32.4  RDW 13.2 13.2  PLT 318 319   Thyroid No results for input(s): "TSH", "FREET4" in the last 168 hours.  BNPNo results for input(s): "BNP", "PROBNP" in the last 168 hours.  DDimer  Recent Labs  Lab 07/18/23 0015  DDIMER 0.31     Radiology    DG Chest 2 View  Result Date: 07/17/2023 CLINICAL DATA:  Chest pain EXAM: CHEST - 2 VIEW COMPARISON:  Chest x-ray 07/20/2018 FINDINGS: Sternotomy wires are again seen. The heart size and mediastinal contours are within normal limits. Both lungs are clear. The visualized skeletal structures are unremarkable. IMPRESSION: No active cardiopulmonary disease. Electronically Signed   By: Darliss Cheney M.D.   On: 07/17/2023 23:29    Cardiac Studies   Echocardiogram 03/02/2018 Left ventricle: The cavity size  was normal. Systolic function was    mildly reduced. The estimated ejection fraction was in the range    of 45% to 50%. Hypokinesis of the inferior myocardium.  - Mitral valve: There was mild regurgitation.   -------------------------------------------------------------------  Labs, prior tests, procedures, and surgery:  Coronary artery bypass grafting.   -------------------------------------------------------------------  Study data:  Comparison was made to the study of 08/09/2011.  Study  status:  Routine.  Procedure:  Transthoracic echocardiography.  Image quality was fair.  Study completion:  There were no  complications.          Transthoracic echocardiography.  M-mode,  complete 2D, spectral Doppler, and color Doppler.  Birthdate:  Patient birthdate: 1966/11/04.  Age:  Patient is 57 yr old.  Sex:  Gender: female.    BMI: 28.4 kg/m^2.  Patient status:  Inpatient.  Study date:  Study date: 03/02/2018. Study  time: 09:07 AM.  Location:  ICU/CCU   -------------------------------------------------------------------   -------------------------------------------------------------------  Left ventricle:  The cavity size was normal. Systolic function was  mildly reduced. The estimated ejection fraction was in the range of  45% to 50%.  Regional wall motion abnormalities:   Hypokinesis of  the inferior myocardium.   -------------------------------------------------------------------  Aortic valve:   Trileaflet; normal thickness leaflets. Mobility was  not restricted.  Doppler:  Transvalvular velocity was within the  normal range. There was no stenosis. There was no regurgitation.    -------------------------------------------------------------------  Aorta: Aortic root: The aortic root was normal in size.   -------------------------------------------------------------------  Mitral valve:   Structurally normal valve.   Mobility was not  restricted.  Doppler:  Transvalvular velocity was within the normal  range. There was no evidence for stenosis. There was mild  regurgitation.    Peak gradient (D): 7 mm Hg.   -------------------------------------------------------------------  Left atrium:  The atrium was normal in size.   -------------------------------------------------------------------  Right ventricle:  The cavity size was normal. Wall thickness was  normal. Systolic function was normal.   -------------------------------------------------------------------  Pulmonic valve:    The valve appears to be grossly normal.  Doppler:  Transvalvular velocity was within the normal range. There  was no evidence for stenosis. There was mild regurgitation.   -------------------------------------------------------------------  Tricuspid valve:   Structurally normal valve.    Doppler:  Transvalvular velocity was within the normal range. There was mild  regurgitation.    -------------------------------------------------------------------  Right atrium:  The atrium was normal in size.   -------------------------------------------------------------------  Pericardium: There was no pericardial effusion.   -------------------------------------------------------------------  Systemic veins:  Inferior vena cava: The vessel was normal in size.   Patient Profile     57 y.o. female with HTN, DMII, CAD s/p CABG (2012; LIMA-LAD, SVG-OM, SVG-RPDA) and subsequent PCI (2019; SVG to OM Dist Graft), active tobacco use, chronic HFmrEF, hyperlipidemia, and a history of VitB12 deficiency.  She was admitted on 07/18/2023 for evaluation of NSTEMI.  Assessment & Plan    NSTEMI CAD s/p CABG (2012; LIMA-LAD, SVG-OM, SVG-RPDA) and subsequent PCI (2019; SVG to OM Dist Graft) Patient presenting with worsening complaints of somewhat atypical chest burning that radiates to her back/neck/left arm.  However, she did not present atypically with burning throat complaints when she had her CABG.  States this occurs with squatting but not with walking.  This has been going on for last 2 months however worsened in the last 1 week.  She is now taking nitroglycerin every other day when she has not needed it before.  Also has  several known risk factors for coronary artery disease and remains to be an active smoker.  EKG showing no acute ST-T wave changes.  Troponins mildly elevated and flat in the 40s and 50s. Unclear what else could be contributing to troponin elevation.  With current symptoms and risk factors, we will plan for cardiac catheterization for definitve evaluation. Will load with 324 mg of aspirin Previously on DAPT with aspirin and Brilinta 90 mg twice daily Continue Lopressor 12.5 mg twice daily an statin  Continue IV heparin  Patient patientInformed Consent   Shared Decision Making/Informed Consent{  The risks [stroke (1 in 1000), death (1 in 1000), kidney failure [usually  temporary] (1 in 500), bleeding (1 in 200), allergic reaction [possibly serious] (1 in 200)], benefits (diagnostic support and management of coronary artery disease) and alternatives of a cardiac catheterization were discussed in detail with Ms. Adolphus and she is willing to proceed.      Chronic HFmrEF Echocardiogram in 2019 showed EF 45 to 50%.  Will repeat echocardiogram today.  Patient has had no issues with shortness of breath or volume previously.  Will reassess and titrate medications as indicated.  Hyperlipidemia On simvastatin 40 mg.  LDL 173. Will transition to high intensity post catheterization.  Previous notes state that she had issues with myalgias on atorvastatin.  Type 2 diabetes Continue to hold metformin.  A1c pending  Nicotine dependence Currently smokes 5 to 6 cigarettes a day.  Reports a lot of recent stress, not interested in quitting at this time.  Counseling provided.  For questions or updates, please contact [facility] Please consult [email or web address] for contact info under        Signed, Abagail Kitchens, PA-C  07/18/2023, 8:25 AM     Patient seen and examined with Yvonna Alanis PA-C.  Agree as above, with the following exceptions and changes as noted below.  Patient is currently chest pain-free, admitted by the cardiology moonlighter overnight.  History of coronary artery bypass grafting with Dr. Laneta Simmers 2012, repeat cardiac catheterization 2019.  Describes a strong family history of coronary artery disease with premature CAD on both sides of her family, in addition to that she has poorly controlled risk factors including continued smoking and untreated hyperlipidemia.  She attributes this to lack of insurance though now she feels her insurance situation would support medication compliance gen: NAD, CV: RRR, no murmurs, Lungs: clear, Abd: soft, Extrem: Warm, well perfused, no edema, Neuro/Psych: alert and oriented x 3, normal mood and affect. All available labs,  radiology testing, previous records reviewed.   -Exertional chest discomfort described as a chest burning radiating to her back in her temples, concerning for NSTEMI, troponins mildly elevated but may be indicative of obstructive CAD.  Most recent cath in 2019 with occluded SVG to PDA, and 100% stenosis of SVG to OM distal graft felt to be culprit and stented at that time.  Per report LIMA to LAD seems patent.  Severe native disease.  We discussed that with current presentation of accelerating angina over the last 2 months and worse this week and continued risk factors that are uncontrolled, she warrants cardiac catheterization.  She is fully in support of this and is equally concerned.  Currently chest pain-free.  Telemetry with sinus rhythm, EKG demonstrates sinus rhythm, inferior infarct, presenting EKG with anterolateral Q waves as well.  INFORMED CONSENT: I have reviewed the risks, indications, and alternatives to cardiac catheterization, possible angioplasty, and stenting with the patient. Risks include but are  not limited to bleeding, infection, vascular injury, stroke, myocardial infarction, arrhythmia, kidney injury, radiation-related injury in the case of prolonged fluoroscopy use, emergency cardiac surgery, and death. The patient understands the risks of serious complication is 1-2 in 1000 with diagnostic cardiac cath and 1-2% or less with angioplasty/stenting.    Parke Poisson, MD 07/18/23 8:50 AM

## 2023-07-18 NOTE — ED Notes (Signed)
ED TO INPATIENT HANDOFF REPORT  ED Nurse Name and Phone #: America Brown Name/Age/Gender Kelsey Padilla 57 y.o. female Room/Bed: 044C/044C  Code Status   Code Status: Full Code  Home/SNF/Other Home Patient oriented to: self, place, time, and situation Is this baseline? Yes   Triage Complete: Triage complete  Chief Complaint NSTEMI (non-ST elevated myocardial infarction) [facility]) [I21.4]  Triage Note Patient with burning to the chest that radiates to the mid back. States she feels [facility] when the burning occurs. Denies nvd. Usually can take one nitroglycerin and it goes away but not tonight. States this really increased on Saturday. Really noticed it gets worse with exertion.    Allergies Allergies  Allergen Reactions   Codeine Nausea And Vomiting   Other Nausea And Vomiting    Artificial sweetner   Statins     Patient says cannot tolerate statins d/t muscle cramping.  She is taking Simvastatin 40mg  PTA, reports she can tolerate simvastatin better.     Level of Care/Admitting Diagnosis ED Disposition     ED Disposition  Admit   Condition  --   Comment  Hospital Area: [facility] [[id number]]  Level of Care: Telemetry Cardiac [[id number]]  May admit patient to [facility] or [facility] if equivalent level of care is available:: No  Covid Evaluation: Asymptomatic - no recent exposure (last 10 days) testing not required  Diagnosis: NSTEMI (non-ST elevated myocardial infarction) [facility]) [387564]  Admitting Physician: Ralene Ok [[id number]]  Attending Physician: Wendall Stade [phone]  Certification:: I certify this patient will need inpatient services for at least 2 midnights  Estimated Length of Stay: 2          B Medical/Surgery History Past Medical History:  Diagnosis Date   Anemia, macrocytic    Community acquired pneumonia 03/01/2018   Coronary artery disease    Hyperlipidemia    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 LIMA to LAD, SVG to OM, SVG to RPDA    Tobacco abuse    Vitamin B12 deficiency    Past Surgical History:  Procedure Laterality Date   CARDIAC CATHETERIZATION  07/2011   significnat distal left main and 3 vessel CAD. Normal EF.    CORONARY ARTERY BYPASS GRAFT     CORONARY STENT INTERVENTION N/A 02/28/2018   Procedure: CORONARY STENT INTERVENTION;  Surgeon: Corky Crafts, MD;  Location: [facility] INVASIVE CV LAB;  Service: Cardiovascular;  Laterality: N/A;   IABP INSERTION N/A 02/28/2018   Procedure: IABP Insertion;  Surgeon: Corky Crafts, MD;  Location: [facility] INVASIVE CV LAB;  Service: Cardiovascular;  Laterality: N/A;   LEFT HEART CATH AND CORONARY ANGIOGRAPHY N/A 02/28/2018   Procedure: LEFT HEART CATH AND CORONARY ANGIOGRAPHY;  Surgeon: Corky Crafts, MD;  Location: [facility] INVASIVE CV LAB;  Service: Cardiovascular;  Laterality: N/A;   LEFT HEART CATH AND CORS/GRAFTS ANGIOGRAPHY N/A 02/28/2018   Procedure: LEFT HEART CATH AND CORS/GRAFTS ANGIOGRAPHY;  Surgeon: Corky Crafts, MD;  Location: [facility] INVASIVE CV LAB;  Service: Cardiovascular;  Laterality: N/A;   RIGHT HEART CATH N/A 02/28/2018   Procedure: RIGHT HEART CATH;  Surgeon: Corky Crafts, MD;  Location: [facility] INVASIVE CV LAB;  Service: Cardiovascular;  Laterality: N/A;     A IV Location/Drains/Wounds Patient Lines/Drains/Airways Status     Active Line/Drains/Airways     Name Placement date Placement time Site Days   Peripheral IV 07/18/23 20 G Left Antecubital 07/18/23  0018  Antecubital  less than 1  Intake/Output Last 24 hours No intake or output data in the 24 hours ending 07/18/23 1314  Labs/Imaging Results for orders placed or performed during the hospital encounter of 07/17/23 (from the past 48 hour(s))  Basic metabolic panel     Status: Abnormal   Collection Time: 07/17/23 10:35 PM  Result Value Ref Range   Sodium 137 135 - 145 mmol/L   Potassium 4.5 3.5 - 5.1 mmol/L   Chloride 105 98 - 111 mmol/L   CO2 24 22 - 32 mmol/L    Glucose, Bld 110 (H) 70 - 99 mg/dL    Comment: Glucose reference range applies only to samples taken after fasting for at least 8 hours.   BUN 14 6 - 20 mg/dL   Creatinine, Ser 7.82 (H) 0.44 - 1.00 mg/dL   Calcium 9.2 8.9 - 95.6 mg/dL   GFR, Estimated 57 (L) >60 mL/min    Comment: (NOTE) Calculated using the CKD-EPI Creatinine Equation (2021)    Anion gap 8 5 - 15    Comment: Performed at [facility], [address] [place], [place] [address]  CBC     Status: None   Collection Time: 07/17/23 10:35 PM  Result Value Ref Range   WBC 9.4 4.0 - 10.5 K/uL   RBC 4.18 3.87 - 5.11 MIL/uL   Hemoglobin 12.5 12.0 - 15.0 g/dL   HCT 65.7 84.6 - 96.2 %   MCV 92.8 80.0 - 100.0 fL   MCH 29.9 26.0 - 34.0 pg   MCHC 32.2 30.0 - 36.0 g/dL   RDW 95.2 84.1 - 32.4 %   Platelets 318 150 - 400 K/uL   nRBC 0.0 0.0 - 0.2 %    Comment: Performed at [facility] Lab, [address], [place], [place] [address]  Troponin I (High Sensitivity)     Status: Abnormal   Collection Time: 07/17/23 10:35 PM  Result Value Ref Range   Troponin I (High Sensitivity) 44 (H) <18 ng/L    Comment: (NOTE) Elevated high sensitivity troponin I (hsTnI) values and significant  changes across serial measurements may suggest ACS but many other  chronic and acute conditions are known to elevate hsTnI results.  Refer to the "Links" section for chest pain algorithms and additional  guidance. Performed at [facility], [address], [place], [place] [address]   D-dimer, quantitative     Status: None   Collection Time: 07/18/23 12:15 AM  Result Value Ref Range   D-Dimer, Quant 0.31 0.00 - 0.50 ug/mL-FEU    Comment: (NOTE) At the manufacturer cut-off value of 0.5 g/mL FEU, this assay has a negative predictive value of 95-100%.This assay is intended for use in conjunction with a clinical pretest probability (PTP) assessment model to exclude pulmonary embolism (PE) and deep venous thrombosis (DVT) in  outpatients suspected of PE or DVT. Results should be correlated with clinical presentation. Performed at [facility], [address], [place], [place] [address]   Troponin I (High Sensitivity)     Status: Abnormal   Collection Time: 07/18/23 12:15 AM  Result Value Ref Range   Troponin I (High Sensitivity) 54 (H) <18 ng/L    Comment: (NOTE) Elevated high sensitivity troponin I (hsTnI) values and significant  changes across serial measurements may suggest ACS but many other  chronic and acute conditions are known to elevate hsTnI results.  Refer to the "Links" section for chest pain algorithms and additional  guidance. Performed at [facility]  Lab, [address] [place], [place] [address]   HIV Antibody (routine testing w rflx)     Status: None   Collection Time: 07/18/23  1:18 AM  Result Value Ref Range   HIV Screen 4th Generation wRfx Non Reactive Non Reactive    Comment: Performed at [facility], [address] [place], [place] [address]  Basic metabolic panel     Status: Abnormal   Collection Time: 07/18/23  1:18 AM  Result Value Ref Range   Sodium 137 135 - 145 mmol/L   Potassium 4.0 3.5 - 5.1 mmol/L   Chloride 105 98 - 111 mmol/L   CO2 20 (L) 22 - 32 mmol/L   Glucose, Bld 118 (H) 70 - 99 mg/dL    Comment: Glucose reference range applies only to samples taken after fasting for at least 8 hours.   BUN 14 6 - 20 mg/dL   Creatinine, Ser 0.98 (H) 0.44 - 1.00 mg/dL   Calcium 9.1 8.9 - 11.9 mg/dL   GFR, Estimated 58 (L) >60 mL/min    Comment: (NOTE) Calculated using the CKD-EPI Creatinine Equation (2021)    Anion gap 12 5 - 15    Comment: Performed at [facility] Lab, [address] [place], [place] [address]  Lipid panel     Status: Abnormal   Collection Time: 07/18/23  1:18 AM  Result Value Ref Range   Cholesterol 237 (H) 0 - 200 mg/dL   Triglycerides 956 <213 mg/dL   HDL 44 >08 mg/dL   Total CHOL/HDL Ratio 5.4 RATIO   VLDL 20 0 - 40 mg/dL    LDL Cholesterol 657 (H) 0 - 99 mg/dL    Comment:        Total Cholesterol/HDL:CHD Risk Coronary Heart Disease Risk Table                     Men   Women  1/2 Average Risk   3.4   3.3  Average Risk       5.0   4.4  2 X Average Risk   9.6   7.1  3 X Average Risk  23.4   11.0        Use the calculated Patient Ratio above and the CHD Risk Table to determine the patient's CHD Risk.        ATP III CLASSIFICATION (LDL):  <100     mg/dL   Optimal  846-962  mg/dL   Near or Above                    Optimal  130-159  mg/dL   Borderline  952-841  mg/dL   High  >324     mg/dL   Very High Performed at [facility] Lab, [address] [place], [place] [address]   CBC     Status: None   Collection Time: 07/18/23  1:18 AM  Result Value Ref Range   WBC 9.0 4.0 - 10.5 K/uL   RBC 4.10 3.87 - 5.11 MIL/uL   Hemoglobin 12.3 12.0 - 15.0 g/dL   HCT 72.5 36.6 - 44.0 %   MCV 92.7 80.0 - 100.0 fL   MCH 30.0 26.0 - 34.0 pg   MCHC 32.4 30.0 - 36.0 g/dL   RDW 34.7 42.5 - 95.6 %   Platelets 319 150 - 400 K/uL   nRBC 0.0 0.0 - 0.2 %    Comment: Performed at [facility] Lab,  [address] [place], [place] [address]  Heparin level (unfractionated)     Status: Abnormal   Collection Time: 07/18/23  5:47 AM  Result Value Ref Range   Heparin Unfractionated 0.19 (L) 0.30 - 0.70 IU/mL    Comment: (NOTE) The clinical reportable range upper limit is being lowered to >1.10 to align with the FDA approved guidance for the current laboratory assay.  If heparin results are below expected values, and patient dosage has  been confirmed, suggest follow up testing of antithrombin III levels. Performed at [facility] Lab, [address] [place], [place] [address]   Troponin I (High Sensitivity)     Status: Abnormal   Collection Time: 07/18/23  6:15 AM  Result Value Ref Range   Troponin I (High Sensitivity) 45 (H) <18 ng/L    Comment: (NOTE) Elevated high sensitivity troponin I (hsTnI) values and  significant  changes across serial measurements may suggest ACS but many other  chronic and acute conditions are known to elevate hsTnI results.  Refer to the "Links" section for chest pain algorithms and additional  guidance. Performed at [facility] Lab, [address], [place], [place] [address]    DG Chest 2 View  Result Date: 07/17/2023 CLINICAL DATA:  Chest pain EXAM: CHEST - 2 VIEW COMPARISON:  Chest x-ray 07/20/2018 FINDINGS: Sternotomy wires are again seen. The heart size and mediastinal contours are within normal limits. Both lungs are clear. The visualized skeletal structures are unremarkable. IMPRESSION: No active cardiopulmonary disease. Electronically Signed   By: Darliss Cheney M.D.   On: 07/17/2023 23:29    Pending Labs Unresulted Labs (From admission, onward)     Start     Ordered   07/19/23 0500  Heparin level (unfractionated)  Daily,   R      07/18/23 0005   07/18/23 1400  Heparin level (unfractionated)  Once-Timed,   TIMED        07/18/23 0720            Vitals/Pain Today's Vitals   07/18/23 0930 07/18/23 0945 07/18/23 1000 07/18/23 1145  BP:    111/84  Pulse: 81 91 79 76  Resp: 11 (!) 25 20 15   Temp:      TempSrc:      SpO2: 100% 100% 100% 97%  Weight:      Height:      PainSc:        Isolation Precautions No active isolations  Medications Medications  heparin ADULT infusion 100 units/mL (25000 units/227mL) (1,150 Units/hr Intravenous Rate/Dose Change 07/18/23 0741)  aspirin EC tablet 81 mg (has no administration in time range)  nitroGLYCERIN (NITROSTAT) SL tablet 0.4 mg (has no administration in time range)  acetaminophen (TYLENOL) tablet 650 mg (has no administration in time range)  ondansetron (ZOFRAN) injection 4 mg (has no administration in time range)  metoprolol tartrate (LOPRESSOR) tablet 12.5 mg (12.5 mg Oral Not Given 07/18/23 0943)  ticagrelor (BRILINTA) tablet 90 mg (90 mg Oral Given 07/18/23 1233)  simvastatin (ZOCOR) tablet 40 mg  (40 mg Oral Not Given 07/18/23 0131)  0.9 %  sodium chloride infusion (has no administration in time range)  heparin bolus via infusion 4,000 Units (4,000 Units Intravenous Bolus from Bag 07/18/23 0021)  heparin bolus via infusion 1,000 Units (1,000 Units Intravenous Bolus from Bag 07/18/23 0742)    Mobility walks     Focused Assessments Cardiac Assessment Handoff:  Cardiac Rhythm: Normal sinus rhythm Lab Results  Component Value Date  TROPONINI 22.35 (HH) 03/03/2018   Lab Results  Component Value Date   DDIMER 0.31 07/18/2023   Does the Patient currently have chest pain? No    R Recommendations: See Admitting Provider Note  Report given to:   Additional Notes: Pt is AO, walkytalky, family at bedside, able to verbalize needs, takes meds whole, on a heparin gtt,

## 2023-07-18 NOTE — Progress Notes (Incomplete)
ANTICOAGULATION CONSULT NOTE - Initial Consult  Pharmacy Consult for Heparin Indication: chest pain/ACS  Allergies  Allergen Reactions  . Codeine Nausea And Vomiting  . Statins     Patient says cannot tolerate statins d/t muscle cramping.  She is taking Simvastatin 40mg  PTA, reports she can tolerate simvastatin better.     Patient Measurements: Height: 5\' 3"  (160 cm) Weight: 70 kg (154 lb 5.2 oz) IBW/kg (Calculated) : 52.4 Heparin Dosing Weight: 67 kg  Vital Signs: Temp: 97.9 F (36.6 C) (07/31 2345) Temp Source: Oral (07/31 2345) BP: 127/84 (07/31 2345) Pulse Rate: 70 (07/31 2345)  Labs: Recent Labs    07/17/23 2235  HGB 12.5  HCT 38.8  PLT 318  CREATININE 1.14*  TROPONINIHS 44*    Estimated Creatinine Clearance: 52.3 mL/min (A) (by C-G formula based on SCr of 1.14 mg/dL (H)).   Medical History: Past Medical History:  Diagnosis Date  . Anemia, macrocytic   . Community acquired pneumonia 03/01/2018  . Coronary artery disease   . Hyperlipidemia   . 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 LIMA to LAD, SVG to OM, SVG to RPDA  . Tobacco abuse   . Vitamin B12 deficiency      Assessment: 57 yo W with hx CABG in 2012 and new persistent chest pain for 2 months despite nitroglycerin. No anticoagulation prior to admission. Pharmacy consulted for heparin.      Goal of Therapy:  Heparin level 0.3-0.7 units/ml Monitor platelets by anticoagulation protocol: Yes   Plan:  Heparin 4000 unit bolus then infusion at 1000 units/hr Monitor daily heparin level, CBC, signs/symptoms of bleeding   Alphia Moh, PharmD, BCPS, [facility] Clinical Pharmacist  Please check AMION for all [facility] Pharmacy phone numbers After 10:00 PM, call Main Pharmacy [phone]

## 2023-07-18 NOTE — Progress Notes (Signed)
ANTICOAGULATION CONSULT NOTE  Pharmacy Consult for Heparin Indication: chest pain/ACS  Allergies  Allergen Reactions   Codeine Nausea And Vomiting   Other Nausea And Vomiting    Artificial sweetner   Statins     Patient says cannot tolerate statins d/t muscle cramping.  She is taking Simvastatin 40mg  PTA, reports she can tolerate simvastatin better.     Patient Measurements: Height: 5\' 3"  (160 cm) Weight: 70 kg (154 lb 5.2 oz) IBW/kg (Calculated) : 52.4 Heparin Dosing Weight: 67 kg  Vital Signs: Temp: 98 F (36.7 C) (08/01 0344) Temp Source: Oral (08/01 0344) BP: 133/85 (08/01 0618) Pulse Rate: 75 (08/01 0618)  Labs: Recent Labs    07/17/23 2235 07/18/23 0015 07/18/23 0118 07/18/23 0547 07/18/23 0615  HGB 12.5  --  12.3  --   --   HCT 38.8  --  38.0  --   --   PLT 318  --  319  --   --   HEPARINUNFRC  --   --   --  0.19*  --   CREATININE 1.14*  --  1.12*  --   --   TROPONINIHS 44* 54*  --   --  45*    Estimated Creatinine Clearance: 53.2 mL/min (A) (by C-G formula based on SCr of 1.12 mg/dL (H)).   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 LIMA to LAD, SVG to OM, SVG to RPDA   Tobacco abuse    Vitamin B12 deficiency      Assessment: 57 yo W with hx CABG in 2012 and new persistent chest pain for 2 months despite nitroglycerin. No anticoagulation prior to admission. Pharmacy consulted for heparin.     Heparin level subtherapeutic (0.19) on infusion at 1000 units/hr. No issues with line or bleeding reported per RN.  Goal of Therapy:  Heparin level 0.3-0.7 units/ml Monitor platelets by anticoagulation protocol: Yes   Plan:  Rebolus heparin 1000 units and increase heparin infusion to 1150 units/hr F/u 6hr heparin level Monitor daily heparin level, CBC, signs/symptoms of bleeding   Christoper Fabian,  PharmD, BCPS Please see amion for complete clinical pharmacist phone list 07/18/2023 7:19 AM

## 2023-07-18 NOTE — ED Notes (Signed)
Patient is leaving the floor in stable condition with staff from cath lab and her belongings, family present at bedside.

## 2023-07-18 NOTE — Progress Notes (Signed)
2D echo attempted, cath lab took her to procedure. Will try later.

## 2023-07-19 ENCOUNTER — Encounter (HOSPITAL_COMMUNITY): Payer: Self-pay | Admitting: Internal Medicine

## 2023-07-19 ENCOUNTER — Inpatient Hospital Stay (HOSPITAL_COMMUNITY): Payer: Medicaid Other

## 2023-07-19 ENCOUNTER — Telehealth: Payer: Self-pay | Admitting: Cardiology

## 2023-07-19 DIAGNOSIS — I38 Endocarditis, valve unspecified: Secondary | ICD-10-CM

## 2023-07-19 DIAGNOSIS — I214 Non-ST elevation (NSTEMI) myocardial infarction: Secondary | ICD-10-CM

## 2023-07-19 MED ORDER — EMPAGLIFLOZIN 10 MG PO TABS: 10.0000 mg | ORAL_TABLET | Freq: Every day | ORAL | 11 refills | Status: DC

## 2023-07-19 MED ORDER — PERFLUTREN LIPID MICROSPHERE
1.0000 mL | INTRAVENOUS | Status: AC | PRN
  Administered 2023-07-19: 4 mL via INTRAVENOUS

## 2023-07-19 MED ORDER — SPIRONOLACTONE 25 MG PO TABS: 12.5000 mg | ORAL_TABLET | Freq: Every day | ORAL | 4 refills | Status: DC

## 2023-07-19 MED ORDER — SIMVASTATIN 40 MG PO TABS: 40.0000 mg | ORAL_TABLET | Freq: Every day | ORAL | 11 refills | Status: DC

## 2023-07-19 MED ORDER — METOPROLOL SUCCINATE ER 25 MG PO TB24: 25.0000 mg | ORAL_TABLET | Freq: Every day | ORAL | 11 refills | Status: DC

## 2023-07-19 MED ORDER — SPIRONOLACTONE 12.5 MG HALF TABLET
12.5000 mg | ORAL_TABLET | Freq: Every day | ORAL | Status: DC
  Administered 2023-07-19: 12.5 mg via ORAL
  Filled 2023-07-19: qty 1

## 2023-07-19 MED FILL — Ticagrelor Tab 90 MG: ORAL | Qty: 2 | Status: AC

## 2023-07-19 NOTE — Telephone Encounter (Signed)
   Transition of Care Follow-up Phone Call Request    Patient Name: Kelsey Padilla Date of Birth: 12/26/1965 Date of Encounter: 07/19/2023  Primary Care Provider:  [facility] Primary Cardiologist:  Dina Rich, MD  [name] has been scheduled for a transition of care follow up appointment with a [facility] provider:  Unable to find any availability in the next 2 weeks.  This is a patient that has been lost in follow-up.  Please reach out to patient to schedule appointment.  Additionally, patient needs lab work to be completed within 1 week since starting SGLT2 inhibitor and spironolactone.  Please discuss how to perform lab work.  Please reach out to [name] within 48 hours to confirm appointment and review transition of care protocol questionnaire.  Abagail Kitchens, PA-C  07/19/2023, 12:18 PM

## 2023-07-19 NOTE — Progress Notes (Signed)
  Echocardiogram 2D Echocardiogram has been performed.  Leda Roys RDCS 07/19/2023, 8:32 AM

## 2023-07-19 NOTE — Telephone Encounter (Signed)
Advised patient on this appointment/understanding

## 2023-07-19 NOTE — Plan of Care (Signed)

## 2023-07-19 NOTE — Progress Notes (Signed)
CARDIAC REHAB PHASE I   PRE:  Rate/Rhythm: 77 SR  BP:  Sitting: 122/78      SaO2: 96 RA  MODE:  Ambulation/: 240 ft   POST:  Rate/Rhythm: 84 SR  BP:  Sitting: 128/80      SaO2: 97 RA   Pt ambulated independently in hallway. Tolerated well with no CP,SOB or dizziness. Returned to be with call bell and bedside table in reach. Post MI/stent education including site care, restrictions, risk factors, exercise guidelines, NTG use, antiplatelet therapy importance, heart healthy diabetic diet, smoking cessation, MI booklet and CRP2 reviewed. All questions and concerns addressed. Will refer to [facility]  for CRP2. Plan for home later today.  [id number]   Woodroe Chen, RN BSN 07/19/2023 9:50 AM

## 2023-07-19 NOTE — Plan of Care (Signed)
Problem: Education: Goal: Understanding of cardiac disease, CV risk reduction, and recovery process will improve 07/19/2023 1228 by Georgiann Mccoy, RN Outcome: Adequate for Discharge 07/19/2023 0915 by Georgiann Mccoy, RN Outcome: Progressing Goal: Individualized Educational Video(s) 07/19/2023 1228 by Georgiann Mccoy, RN Outcome: Adequate for Discharge 07/19/2023 0915 by Georgiann Mccoy, RN Outcome: Progressing   Problem: Activity: Goal: Ability to tolerate increased activity will improve 07/19/2023 1228 by Georgiann Mccoy, RN Outcome: Adequate for Discharge 07/19/2023 0915 by Georgiann Mccoy, RN Outcome: Progressing   Problem: Cardiac: Goal: Ability to achieve and maintain adequate cardiovascular perfusion will improve 07/19/2023 1228 by Georgiann Mccoy, RN Outcome: Adequate for Discharge 07/19/2023 0915 by Georgiann Mccoy, RN Outcome: Progressing   Problem: Health Behavior/Discharge Planning: Goal: Ability to safely manage health-related needs after discharge will improve 07/19/2023 1228 by Georgiann Mccoy, RN Outcome: Adequate for Discharge 07/19/2023 0915 by Georgiann Mccoy, RN Outcome: Progressing   Problem: Education: Goal: Understanding of CV disease, CV risk reduction, and recovery process will improve 07/19/2023 1228 by Georgiann Mccoy, RN Outcome: Adequate for Discharge 07/19/2023 0915 by Georgiann Mccoy, RN Outcome: Progressing Goal: Individualized Educational Video(s) 07/19/2023 1228 by Georgiann Mccoy, RN Outcome: Adequate for Discharge 07/19/2023 0915 by Georgiann Mccoy, RN Outcome: Progressing   Problem: Activity: Goal: Ability to return to baseline activity level will improve 07/19/2023 1228 by Georgiann Mccoy, RN Outcome: Adequate for Discharge 07/19/2023 0915 by Georgiann Mccoy, RN Outcome: Progressing   Problem: Cardiovascular: Goal: Ability to achieve and maintain adequate cardiovascular perfusion will improve 07/19/2023 1228 by Georgiann Mccoy,  RN Outcome: Adequate for Discharge 07/19/2023 0915 by Georgiann Mccoy, RN Outcome: Progressing Goal: Vascular access site(s) Level 0-1 will be maintained 07/19/2023 1228 by Georgiann Mccoy, RN Outcome: Adequate for Discharge 07/19/2023 0915 by Georgiann Mccoy, RN Outcome: Progressing   Problem: Health Behavior/Discharge Planning: Goal: Ability to safely manage health-related needs after discharge will improve 07/19/2023 1228 by Georgiann Mccoy, RN Outcome: Adequate for Discharge 07/19/2023 0915 by Georgiann Mccoy, RN Outcome: Progressing   Problem: Education: Goal: Knowledge of General Education information will improve Description: Including pain rating scale, medication(s)/side effects and non-pharmacologic comfort measures 07/19/2023 1228 by Georgiann Mccoy, RN Outcome: Adequate for Discharge 07/19/2023 0915 by Georgiann Mccoy, RN Outcome: Progressing   Problem: Health Behavior/Discharge Planning: Goal: Ability to manage health-related needs will improve 07/19/2023 1228 by Georgiann Mccoy, RN Outcome: Adequate for Discharge 07/19/2023 0915 by Georgiann Mccoy, RN Outcome: Progressing   Problem: Clinical Measurements: Goal: Ability to maintain clinical measurements within normal limits will improve 07/19/2023 1228 by Georgiann Mccoy, RN Outcome: Adequate for Discharge 07/19/2023 0915 by Georgiann Mccoy, RN Outcome: Progressing Goal: Will remain free from infection 07/19/2023 1228 by Georgiann Mccoy, RN Outcome: Adequate for Discharge 07/19/2023 0915 by Georgiann Mccoy, RN Outcome: Progressing Goal: Diagnostic test results will improve 07/19/2023 1228 by Georgiann Mccoy, RN Outcome: Adequate for Discharge 07/19/2023 0915 by Georgiann Mccoy, RN Outcome: Progressing Goal: Respiratory complications will improve 07/19/2023 1228 by Georgiann Mccoy, RN Outcome: Adequate for Discharge 07/19/2023 0915 by Georgiann Mccoy, RN Outcome: Progressing Goal: Cardiovascular complication will be  avoided 07/19/2023 1228 by Georgiann Mccoy, RN Outcome: Adequate for Discharge 07/19/2023 0915 by Georgiann Mccoy, RN Outcome: Progressing   Problem: Activity: Goal: Risk for activity intolerance will decrease 07/19/2023 1228 by Georgiann Mccoy, RN Outcome: Adequate for Discharge 07/19/2023  0915 by Georgiann Mccoy, RN Outcome: Progressing   Problem: Nutrition: Goal: Adequate nutrition will be maintained 07/19/2023 1228 by Georgiann Mccoy, RN Outcome: Adequate for Discharge 07/19/2023 0915 by Georgiann Mccoy, RN Outcome: Progressing   Problem: Coping: Goal: Level of anxiety will decrease 07/19/2023 1228 by Georgiann Mccoy, RN Outcome: Adequate for Discharge 07/19/2023 0915 by Georgiann Mccoy, RN Outcome: Progressing   Problem: Elimination: Goal: Will not experience complications related to bowel motility 07/19/2023 1228 by Georgiann Mccoy, RN Outcome: Adequate for Discharge 07/19/2023 0915 by Georgiann Mccoy, RN Outcome: Progressing Goal: Will not experience complications related to urinary retention 07/19/2023 1228 by Georgiann Mccoy, RN Outcome: Adequate for Discharge 07/19/2023 0915 by Georgiann Mccoy, RN Outcome: Progressing   Problem: Pain Managment: Goal: General experience of comfort will improve 07/19/2023 1228 by Georgiann Mccoy, RN Outcome: Adequate for Discharge 07/19/2023 0915 by Georgiann Mccoy, RN Outcome: Progressing   Problem: Safety: Goal: Ability to remain free from injury will improve 07/19/2023 1228 by Georgiann Mccoy, RN Outcome: Adequate for Discharge 07/19/2023 0915 by Georgiann Mccoy, RN Outcome: Progressing   Problem: Skin Integrity: Goal: Risk for impaired skin integrity will decrease 07/19/2023 1228 by Georgiann Mccoy, RN Outcome: Adequate for Discharge 07/19/2023 0915 by Georgiann Mccoy, RN Outcome: Progressing

## 2023-07-19 NOTE — Discharge Summary (Signed)
Discharge Summary    Patient ID: Kelsey Padilla MRN: [id number]; DOB: [birth year]  Admit date: 07/17/2023 Discharge date: 07/19/2023  PCP:  [facility]   [facility] Providers Cardiologist:  Dina Rich, MD   {   Discharge Diagnoses    Principal Problem:   NSTEMI (non-ST elevated myocardial infarction) [facility]) Active Problems:   Hyperlipidemia   S/P CABG x 3   Tobacco abuse   Ischemic cardiomyopathy    Diagnostic Studies/Procedures    Echocardiogram 07/19/2023  1. Left ventricular ejection fraction, by estimation, is 40 to 45%. The  left ventricle has mildly decreased function. The left ventricle  demonstrates regional wall motion abnormalities with akinesis of the basal  to mid inferior and the basal  inferoseptal wall segments, akinesis of the basal anterolateral wall,  severe hypokinesis of the basal to mid inferolateral wall. There is mild  concentric left ventricular hypertrophy. Left ventricular diastolic  parameters are consistent with Grade I  diastolic dysfunction (impaired relaxation).   2. Right ventricular systolic function is mildly reduced. The right  ventricular size is normal. Tricuspid regurgitation signal is inadequate  for assessing PA pressure.   3. The mitral valve is normal in structure. Trivial mitral valve  regurgitation. No evidence of mitral stenosis.   4. The aortic valve is tricuspid. There is mild calcification of the  aortic valve. Aortic valve regurgitation is not visualized. No aortic  stenosis is present.   5. The inferior vena cava is normal in size with greater than 50%  respiratory variability, suggesting right atrial pressure of 3 mmHg.   Left heart catheterization 07/18/2023 Mid LM to Ost LAD lesion is 95% stenosed.   Prox LAD to Mid LAD lesion is 100% stenosed.   Prox LAD lesion is 50% stenosed.   Prox RCA to Mid RCA lesion is 75% stenosed.   Dist RCA lesion is 100% stenosed.   Origin lesion is  100% stenosed.   Ost 2nd Mrg lesion is 100% stenosed.   Origin lesion is 100% stenosed.   Ost Cx lesion is 75% stenosed.   Non-stenotic Dist Graft to Insertion lesion was previously treated.   A stent was successfully placed.   A stent was successfully placed.   Post intervention, there is a 0% residual stenosis.   Post intervention, there is a 0% residual stenosis.   1.  Patent LIMA to LAD with occluded vein graft to obtuse marginal and occluded vein graft to PDA. 2.  Severe native vessel disease with chronic total occlusion of right coronary artery and LAD. 3.  Given the occluded vein graft to obtuse marginal, the left circumflex remains unrevascularized and give collaterals to the right coronary artery; for this reason complex PCI of the distal left main into the ostial left circumflex was pursued requiring multiple wires, Cutting Balloon angioplasty, Shockwave lithotripsy, and drug-eluting stent x 1. 4.  LVEDP of 18 mmHg. 5.  Poor left radial pulse necessitating femoral approach. 6.  Successful Perclose management of right femoral arteriotomy.   Recommendation: Dual antiplatelet therapy for at least 1 year and aggressive cardiac risk factor management. _____________   History of Present Illness     Kelsey Padilla is a 57 y.o. female with HTN, DMII, CAD s/p CABG (2012; LIMA-LAD, SVG-OM, SVG-RPDA) and subsequent PCI (2019; SVG to OM Dist Graft), active tobacco use, chronic HFmrEF, hyperlipidemia, and a history of VitB12 deficiency.  She was previously seen in 2019 however due to insurance issues was lost in  follow-up.  Patient had been complaining about episodes of discomfort going on for at least the last 2 months.  States that these episodes feel somewhat similar to her previous episode when she was admitted for CABG.  Her complaints are somewhat atypical and that she has chest burning that radiates to the back/neck/left arm.  However previously had presented previously. She states that  these episodes are exertional in nature and had been experiencing some minor shortness of breath.  She has extensive family history as well as her own personal coronary artery disease.  EKG did not show any acute ST-T wave changes however did show inferior Q waves.  Troponins were slightly elevated at 44 then 54.  Due to these complaints and elevated troponins patient was taken for cardiac catheterization for further evaluation.  Hospital Course     Consultants:    NSTEMI CAD s/p CABG (2012; LIMA-LAD, SVG-OM, SVG-RPDA) and subsequent PCI (2019; SVG to OM Dist Graft) Patient presenting with worsening complaints of somewhat atypical chest burning that radiates to her back/neck/left arm.  Underwent cardiac catheterization showed patent LIMA to LAD with occluded vein graft to obtuse marginal and occluded vein graft to PDA.  She has known severe native vessel disease with chronic total occlusion of RCA and LAD.  Underwent complex PCI of the distal left main into the ostial left circumflex that required multiple wires, Cutting Balloon angioplasty, shockwave lithotripsy and DES.  She had poor left radial pulses so right femoral access was obtained. Previously on DAPT with aspirin and Brilinta 90 mg twice daily.  Continue this uninterrupted for the next year. Continue beta-blocker and statin as below Discussed at great lengths the need for aggressive secondary management and lifestyle modifications including smoking cessation.   Chronic HFmrEF Echocardiogram in 2019 showed EF 45 to 50%. Patient has had no issues with shortness of breath or volume previously.   Will discharge on Jardiance and low-dose spironolactone 12.5 mg.  Further titration of GDMT to be done outpatient.  Patient has normal to soft blood pressures here, and states that she has had increased sensitivity to any antihypertensives.  Consider renal ultrasound as outpatient. Continue Jardiance 10 mg, spironolactone 12.5 mg.  Will consolidate  Lopressor 12.5 mg to Toprol-XL 25 mg. Lab work to be completed in 1 week since starting above medications.  Hyperlipidemia On simvastatin 40 mg and fenofibrate. LDL 173. Will transition to high intensity post catheterization.  Previous notes state that she had issues with myalgias on atorvastatin. Will also arrange follow-up in lipid clinic to discuss PCSK9i   Type 2 diabetes Resume metformin greater than 48 hours post catheterization.  A1c pending   Nicotine dependence Currently smokes 5 to 6 cigarettes a day.  Reports a lot of recent stress, not interested in quitting at this time.  Counseling provided.  Patient seen by myself and Dr. Jacques Navy and deemed stable for discharge.  Follow-up messaging has been made to [place] office to arrange follow-up.  Groin catheter site has been reviewed and inspected with no acute complications.  She has lab work to complete 1 week post discharge.  Lipid clinic referral has been made.  Did the patient have an acute coronary syndrome (MI, NSTEMI, STEMI, etc) this admission?:  Yes                               AHA/ACC Clinical Performance & Quality Measures: Aspirin prescribed? - Yes ADP Receptor Inhibitor (Plavix/Clopidogrel, Brilinta/Ticagrelor  or Effient/Prasugrel) prescribed (includes medically managed patients)? - Yes Beta Blocker prescribed? - Yes High Intensity Statin (Lipitor 40-80mg  or Crestor 20-40mg ) prescribed? - No - Arranging lipid clinc follow up  EF assessed during THIS hospitalization? - Yes For EF <40%, was ACEI/ARB prescribed? - Not Applicable (EF >/= 40%) For EF <40%, Aldosterone Antagonist (Spironolactone or Eplerenone) prescribed? - Yes Cardiac Rehab Phase II ordered (including medically managed patients)? - Yes       The patient will be scheduled for a TOC follow up appointment in 14 days.  A message has been sent to the [facility] and Scheduling Pool at the office where the patient should be seen for follow up.   _____________  Discharge Vitals Blood pressure 122/78, pulse 77, temperature 98.2 F (36.8 C), temperature source Oral, resp. rate 20, height 5\' 3"  (1.6 m), weight 70 kg, SpO2 99%.  Filed Weights   07/17/23 2230  Weight: 70 kg   Physical Exam Cardiovascular:     Rate and Rhythm: Normal rate and regular rhythm.     Pulses: Normal pulses.     Heart sounds: Normal heart sounds.  Pulmonary:     Effort: Pulmonary effort is normal.     Breath sounds: Normal breath sounds.  Abdominal:     General: Abdomen is flat. Bowel sounds are normal.     Palpations: Abdomen is soft.  Skin:    General: Skin is warm and dry.     Capillary Refill: Capillary refill takes less than 2 seconds.   Right femoral catheter site free of discharge, redness, tenderness, bleeding or bruising. Labs & Radiologic Studies    CBC Recent Labs    07/18/23 0118 07/19/23 0114  WBC 9.0 6.1  HGB 12.3 12.2  HCT 38.0 36.6  MCV 92.7 92.2  PLT 319 290   Basic Metabolic Panel Recent Labs    40/98/11 0118 07/19/23 0114  NA 137 137  K 4.0 3.5  CL 105 106  CO2 20* 20*  GLUCOSE 118* 119*  BUN 14 12  CREATININE 1.12* 0.92  CALCIUM 9.1 9.0   Liver Function Tests No results for input(s): "AST", "ALT", "ALKPHOS", "BILITOT", "PROT", "ALBUMIN" in the last 72 hours. No results for input(s): "LIPASE", "AMYLASE" in the last 72 hours. High Sensitivity Troponin:   Recent Labs  Lab 07/17/23 2235 07/18/23 0015 07/18/23 0615  TROPONINIHS 44* 54* 45*    BNP Invalid input(s): "POCBNP" D-Dimer Recent Labs    07/18/23 0015  DDIMER 0.31   Hemoglobin A1C No results for input(s): "HGBA1C" in the last 72 hours. Fasting Lipid Panel Recent Labs    07/18/23 0118  CHOL 237*  HDL 44  LDLCALC 173*  TRIG 102  CHOLHDL 5.4   Thyroid Function Tests No results for input(s): "TSH", "T4TOTAL", "T3FREE", "THYROIDAB" in the last 72 hours.  Invalid input(s): "FREET3" _____________  ECHOCARDIOGRAM COMPLETE  Result  Date: 07/19/2023    ECHOCARDIOGRAM REPORT   Patient Name:   Kelsey Padilla Date of Exam: 07/19/2023 Medical Rec #:  [id number]      Height:       63.0 in Accession #:    [id number]     Weight:       154.3 lb Date of Birth:  04-30-1966      BSA:          1.732 m Patient Age:    57 years       BP:           120/77 mmHg  Patient Gender: F              HR:           65 bpm. Exam Location:  Inpatient Procedure: 2D Echo, Cardiac Doppler, Color Doppler and Intracardiac            Opacification Agent Indications:    122-I22.9 Subsequent ST elevation (STEM) and non-ST elevation                 (NSTEMI) myocardial infarction  History:        Patient has prior history of Echocardiogram examinations, most                 recent 03/02/2018. CAD, Prior CABG; Risk Factors:Dyslipidemia.  Sonographer:    Harriette Bouillon RDCS Referring Phys: [id number] Ralene Ok IMPRESSIONS  1. Left ventricular ejection fraction, by estimation, is 40 to 45%. The left ventricle has mildly decreased function. The left ventricle demonstrates regional wall motion abnormalities with akinesis of the basal to mid inferior and the basal inferoseptal wall segments, akinesis of the basal anterolateral wall, severe hypokinesis of the basal to mid inferolateral wall. There is mild concentric left ventricular hypertrophy. Left ventricular diastolic parameters are consistent with Grade I diastolic dysfunction (impaired relaxation).  2. Right ventricular systolic function is mildly reduced. The right ventricular size is normal. Tricuspid regurgitation signal is inadequate for assessing PA pressure.  3. The mitral valve is normal in structure. Trivial mitral valve regurgitation. No evidence of mitral stenosis.  4. The aortic valve is tricuspid. There is mild calcification of the aortic valve. Aortic valve regurgitation is not visualized. No aortic stenosis is present.  5. The inferior vena cava is normal in size with greater than 50% respiratory variability, suggesting  right atrial pressure of 3 mmHg. FINDINGS  Left Ventricle: Left ventricular ejection fraction, by estimation, is 40 to 45%. The left ventricle has mildly decreased function. The left ventricle demonstrates regional wall motion abnormalities. Definity contrast agent was given IV to delineate the left ventricular endocardial borders. The left ventricular internal cavity size was normal in size. There is mild concentric left ventricular hypertrophy. Left ventricular diastolic parameters are consistent with Grade I diastolic dysfunction (impaired relaxation). Right Ventricle: The right ventricular size is normal. No increase in right ventricular wall thickness. Right ventricular systolic function is mildly reduced. Tricuspid regurgitation signal is inadequate for assessing PA pressure. Left Atrium: Left atrial size was normal in size. Right Atrium: Right atrial size was normal in size. Pericardium: Trivial pericardial effusion is present. Mitral Valve: The mitral valve is normal in structure. Mild mitral annular calcification. Trivial mitral valve regurgitation. No evidence of mitral valve stenosis. Tricuspid Valve: The tricuspid valve is normal in structure. Tricuspid valve regurgitation is not demonstrated. Aortic Valve: The aortic valve is tricuspid. There is mild calcification of the aortic valve. Aortic valve regurgitation is not visualized. No aortic stenosis is present. Aortic valve mean gradient measures 7.0 mmHg. Aortic valve peak gradient measures 13.7 mmHg. Aortic valve area, by VTI measures 1.65 cm. Pulmonic Valve: The pulmonic valve was normal in structure. Pulmonic valve regurgitation is not visualized. Aorta: The aortic root is normal in size and structure. Venous: The inferior vena cava is normal in size with greater than 50% respiratory variability, suggesting right atrial pressure of 3 mmHg. IAS/Shunts: No atrial level shunt detected by color flow Doppler.  LEFT VENTRICLE PLAX 2D LVIDd:         5.20  cm   Diastology LVIDs:  4.40 cm   LV e' medial:    5.59 cm/s LV PW:         0.80 cm   LV E/e' medial:  13.9 LV IVS:        0.80 cm   LV e' lateral:   10.60 cm/s LVOT diam:     2.20 cm   LV E/e' lateral: 7.3 LV SV:         60 LV SV Index:   35 LVOT Area:     3.80 cm  RIGHT VENTRICLE            IVC RV S prime:     9.64 cm/s  IVC diam: 2.20 cm TAPSE (M-mode): 1.2 cm LEFT ATRIUM             Index        RIGHT ATRIUM          Index LA diam:        4.20 cm 2.43 cm/m   RA Area:     9.40 cm LA Vol (A2C):   39.9 ml 23.04 ml/m  RA Volume:   16.90 ml 9.76 ml/m LA Vol (A4C):   39.3 ml 22.69 ml/m LA Biplane Vol: 41.1 ml 23.73 ml/m  AORTIC VALVE AV Area (Vmax):    1.67 cm AV Area (Vmean):   1.64 cm AV Area (VTI):     1.65 cm AV Vmax:           185.00 cm/s AV Vmean:          124.000 cm/s AV VTI:            0.367 m AV Peak Grad:      13.7 mmHg AV Mean Grad:      7.0 mmHg LVOT Vmax:         81.30 cm/s LVOT Vmean:        53.500 cm/s LVOT VTI:          0.159 m LVOT/AV VTI ratio: 0.43  AORTA Ao Root diam: 2.90 cm Ao Asc diam:  3.00 cm MITRAL VALVE MV Area (PHT): 4.21 cm     SHUNTS MV Decel Time: 180 msec     Systemic VTI:  0.16 m MV E velocity: 77.90 cm/s   Systemic Diam: 2.20 cm MV A velocity: 104.00 cm/s MV E/A ratio:  0.75 Dalton McleanMD Electronically signed by Wilfred Lacy Signature Date/Time: 07/19/2023/8:40:43 AM    Final    CARDIAC CATHETERIZATION  Addendum Date: 07/18/2023     Mid LM to Ost LAD lesion is 95% stenosed.   Prox LAD to Mid LAD lesion is 100% stenosed.   Prox LAD lesion is 50% stenosed.   Prox RCA to Mid RCA lesion is 75% stenosed.   Dist RCA lesion is 100% stenosed.   Origin lesion is 100% stenosed.   Ost 2nd Mrg lesion is 100% stenosed.   Origin lesion is 100% stenosed.   Ost Cx lesion is 75% stenosed.   Non-stenotic Dist Graft to Insertion lesion was previously treated.   A stent was successfully placed.   A stent was successfully placed.   Post intervention, there is a 0% residual  stenosis.   Post intervention, there is a 0% residual stenosis. 1.  Patent LIMA to LAD with occluded vein graft to obtuse marginal and occluded vein graft to PDA. 2.  Severe native vessel disease with chronic total occlusion of right coronary artery and LAD. 3.  Given the occluded vein graft to obtuse marginal,  the left circumflex remains unrevascularized and give collaterals to the right coronary artery; for this reason complex PCI of the distal left main into the ostial left circumflex was pursued requiring multiple wires, Cutting Balloon angioplasty, Shockwave lithotripsy, and drug-eluting stent x 1. 4.  LVEDP of 18 mmHg. 5.  Poor left radial pulse necessitating femoral approach. 6.  Successful Perclose management of right femoral arteriotomy. Recommendation: Dual antiplatelet therapy for at least 1 year and aggressive cardiac risk factor management.  Result Date: 07/18/2023   Mid LM to Ost LAD lesion is 75% stenosed.   Prox LAD to Mid LAD lesion is 100% stenosed.   Prox LAD lesion is 50% stenosed.   Prox RCA to Mid RCA lesion is 75% stenosed.   Dist RCA lesion is 100% stenosed.   Origin lesion is 100% stenosed.   Ost 2nd Mrg lesion is 100% stenosed.   Origin lesion is 100% stenosed.   Non-stenotic Dist Graft to Insertion lesion was previously treated.   A stent was successfully placed.   Post intervention, there is a 0% residual stenosis. 1.  Patent LIMA to LAD with occluded vein graft to obtuse marginal and occluded vein graft to PDA. 2.  Severe native vessel disease with chronic total occlusion of right coronary artery and LAD. 3.  Given the occluded vein graft to obtuse marginal, the left circumflex remains unrevascularized and give collaterals to the right coronary artery; for this reason complex PCI of the distal left main into the ostial left circumflex was pursued requiring multiple wires, Cutting Balloon angioplasty, Shockwave lithotripsy, and drug-eluting stent x 1. 4.  LVEDP of 18 mmHg. 5.  Poor left  radial pulse necessitating femoral approach. 6.  Successful Perclose management of right femoral arteriotomy. Recommendation: Dual antiplatelet therapy for at least 1 year and aggressive cardiac risk factor management.   DG Chest 2 View  Result Date: 07/17/2023 CLINICAL DATA:  Chest pain EXAM: CHEST - 2 VIEW COMPARISON:  Chest x-ray 07/20/2018 FINDINGS: Sternotomy wires are again seen. The heart size and mediastinal contours are within normal limits. Both lungs are clear. The visualized skeletal structures are unremarkable. IMPRESSION: No active cardiopulmonary disease. Electronically Signed   By: Darliss Cheney M.D.   On: 07/17/2023 23:29   Disposition   Pt is being discharged home today in good condition.  Follow-up Plans & Appointments     Follow-up Information     Sharlene Dory, NP Follow up.   Specialty: Cardiology Why: Somebody from our office will contact you to schedule this follow-up appointment.  If you do not receive a phone call within 2 days please call our office to schedule. Contact information: [address] [place] [address] [phone]                Discharge Instructions     AMB Referral to Advanced Lipid Disorders Clinic   Complete by: As directed    Internal Lipid Clinic Referral Scheduling  Internal lipid clinic referrals are providers within [facility], who wish to refer established patients for routine management (help in starting PCSK9 inhibitor therapy) or advanced therapies.  Internal MD referral criteria:              1. All patients with LDL>190 mg/dL  2. All patients with Triglycerides >500 mg/dL  3. Patients with suspected or confirmed heterozygous familial hyperlipidemia (HeFH) or homozygous familial hyperlipidemia (HoFH)  4. Patients with family history of suspicious for genetic dyslipidemia desiring genetic testing  5. Patients refractory  to standard guideline based therapy  6. Patients with statin intolerance (failed 2 statins, one  of which must be a high potency statin)  7. Patients who the provider desires to be seen by MD   Internal PharmD referral criteria:   1. Follow-up patients for medication management  2. Follow-up for compliance monitoring  3. Patients for drug education  4. Patients with statin intolerance  5. PCSK9 inhibitor education and prior authorization approvals  6. Patients with triglycerides <500 mg/dL  External Lipid Clinic Referral  External lipid clinic referrals are for providers outside of [facility], considered new clinic patients - automatically routed to MD schedule   Amb Referral to Cardiac Rehabilitation   Complete by: As directed    Diagnosis:  NSTEMI Coronary Stents     After initial evaluation and assessments completed: Virtual Based Care may be provided alone or in conjunction with Phase 2 Cardiac Rehab based on patient barriers.: Yes   Intensive Cardiac Rehabilitation (ICR) [facility] location only OR Traditional Cardiac Rehabilitation (TCR) *If criteria for ICR are not met will enroll in TCR [facility] only): Yes   Diet - low sodium heart healthy   Complete by: As directed    Discharge instructions   Complete by: As directed    Groin Site Care Refer to this sheet in the next few weeks. These instructions provide you with information on caring for yourself after your procedure. Your caregiver may also give you more specific instructions. Your treatment has been planned according to current medical practices, but problems sometimes occur. Call your caregiver if you have any problems or questions after your procedure. HOME CARE INSTRUCTIONS You may shower 24 hours after the procedure. Remove the bandage (dressing) and gently wash the site with plain soap and water. Gently pat the site dry.  Do not apply powder or lotion to the site.  Do not sit in a bathtub, swimming pool, or whirlpool for 5 to 7 days.  No bending, squatting, or lifting anything over 10 pounds (4.5 kg) as directed by  your caregiver.  Inspect the site at least twice daily.  Do not drive home if you are discharged the same day of the procedure. Have someone else drive you.  You may drive 24 hours after the procedure unless otherwise instructed by your caregiver.  What to expect: Any bruising will usually fade within 1 to 2 weeks.  Blood that collects in the tissue (hematoma) may be painful to the touch. It should usually decrease in size and tenderness within 1 to 2 weeks.  SEEK IMMEDIATE MEDICAL CARE IF: You have unusual pain at the groin site or down the affected leg.  You have redness, warmth, swelling, or pain at the groin site.  You have drainage (other than a small amount of blood on the dressing).  You have chills.  You have a fever or persistent symptoms for more than 72 hours.  You have a fever and your symptoms suddenly get worse.  Your leg becomes pale, cool, tingly, or numb.  You have heavy bleeding from the site. Hold pressure on the site. Marland Kitchen   Discharge instructions   Complete by: As directed    Groin Site Care Refer to this sheet in the next few weeks. These instructions provide you with information on caring for yourself after your procedure. Your caregiver may also give you more specific instructions. Your treatment has been planned according to current medical practices, but problems sometimes occur. Call your caregiver if you have  any problems or questions after your procedure. HOME CARE INSTRUCTIONS You may shower 24 hours after the procedure. Remove the bandage (dressing) and gently wash the site with plain soap and water. Gently pat the site dry.  Do not apply powder or lotion to the site.  Do not sit in a bathtub, swimming pool, or whirlpool for 5 to 7 days.  No bending, squatting, or lifting anything over 10 pounds (4.5 kg) as directed by your caregiver.  Inspect the site at least twice daily.  Do not drive home if you are discharged the same day of the procedure. Have someone else  drive you.  You may drive 24 hours after the procedure unless otherwise instructed by your caregiver.  What to expect: Any bruising will usually fade within 1 to 2 weeks.  Blood that collects in the tissue (hematoma) may be painful to the touch. It should usually decrease in size and tenderness within 1 to 2 weeks.  SEEK IMMEDIATE MEDICAL CARE IF: You have unusual pain at the groin site or down the affected leg.  You have redness, warmth, swelling, or pain at the groin site.  You have drainage (other than a small amount of blood on the dressing).  You have chills.  You have a fever or persistent symptoms for more than 72 hours.  You have a fever and your symptoms suddenly get worse.  Your leg becomes pale, cool, tingly, or numb.  You have heavy bleeding from the site. Hold pressure on the site. .  Please wait 2 days before you start taking your metformin again  Somebody from our office will arrange follow-up with you.  Additionally, you will receive another phone call about our lipid clinic.   Increase activity slowly   Complete by: As directed    Increase activity slowly   Complete by: As directed         Discharge Medications   Allergies as of 07/19/2023       Reactions   Codeine Nausea And Vomiting   Other Nausea And Vomiting   Artificial sweetner   Statins    Patient says cannot tolerate statins d/t muscle cramping.  She is taking Simvastatin 40mg  PTA, reports she can tolerate simvastatin better.         Medication List     STOP taking these medications    metoprolol tartrate 25 MG tablet Commonly known as: LOPRESSOR       TAKE these medications    acetaminophen 500 MG tablet Commonly known as: TYLENOL Take 1,000 mg by mouth 2 (two) times daily as needed for moderate pain.   amoxicillin 500 MG capsule Commonly known as: AMOXIL Take 500 mg by mouth 3 (three) times daily.   aspirin 81 MG chewable tablet Chew 1 tablet (81 mg total) by mouth daily.    diphenhydrAMINE 25 MG tablet Commonly known as: BENADRYL Take 25 mg by mouth every 6 (six) hours as needed for allergies.   empagliflozin 10 MG Tabs tablet Commonly known as: Jardiance Take 1 tablet (10 mg total) by mouth daily before breakfast.   fenofibrate 145 MG tablet Commonly known as: TRICOR Take 145 mg by mouth at bedtime.   metFORMIN 500 MG tablet Commonly known as: GLUCOPHAGE Take 500 mg by mouth at bedtime.   metoprolol succinate 25 MG 24 hr tablet Commonly known as: Toprol XL Take 1 tablet (25 mg total) by mouth daily.   nitroGLYCERIN 0.4 MG SL tablet Commonly known as: Nitrostat Place 1 tablet (  0.4 mg total) under the tongue every 5 (five) minutes as needed. What changed: reasons to take this Notes to patient: The proper use and anticipated side effects of nitroglycerine has been carefully explained.  If a single episode of chest pain is not relieved by one tablet, the patient will try another within 5 minutes; and if this doesn't relieve the pain, the patient is instructed to call 911 for transportation to an emergency department.    simvastatin 40 MG tablet Commonly known as: ZOCOR TAKE 1 TABLET BY MOUTH EVERY DAY What changed: when to take this   simvastatin 40 MG tablet Commonly known as: ZOCOR Take 1 tablet (40 mg total) by mouth at bedtime. What changed: You were already taking a medication with the same name, and this prescription was added. Make sure you understand how and when to take each.   spironolactone 25 MG tablet Commonly known as: ALDACTONE Take 0.5 tablets (12.5 mg total) by mouth daily. Start taking on: July 20, 2023   ticagrelor 90 MG Tabs tablet Commonly known as: BRILINTA Take 1 tablet (90 mg total) by mouth 2 (two) times daily.   Vitamin B-12 5000 MCG Lozg Take 5,000 mcg by mouth once a week.           Outstanding Labs/Studies   BMP to be completed within 1 week of discharge  Duration of Discharge Encounter   Greater  than 30 minutes including physician time.  Signed, Abagail Kitchens, PA-C 07/19/2023, 12:25 PM

## 2023-07-22 ENCOUNTER — Other Ambulatory Visit (HOSPITAL_COMMUNITY): Payer: Self-pay

## 2023-07-22 ENCOUNTER — Telehealth: Payer: Self-pay | Admitting: Internal Medicine

## 2023-07-22 ENCOUNTER — Telehealth: Payer: Self-pay

## 2023-07-22 NOTE — Telephone Encounter (Signed)
PA request has been Submitted. New Encounter created for follow up. For additional info see Pharmacy Prior Auth telephone encounter from 07/22/23.

## 2023-07-22 NOTE — Telephone Encounter (Signed)
Pharmacy Patient Advocate Encounter   Received notification from Physician's Office that prior authorization for JARDIANCE is required/requested.   Insurance verification completed.   The patient is insured through [facility] .   Per test claim: PA required; PA submitted to [facility] via CoverMyMeds Key/confirmation #/EOC [id number] Status is pending

## 2023-07-22 NOTE — Telephone Encounter (Signed)
Pharmacy Patient Advocate Encounter  Received notification from [facility] that Prior Authorization for JARDIANCE has been APPROVED from 07/08/23 to 07/21/24. Ran test claim, Copay is $4

## 2023-07-22 NOTE — Telephone Encounter (Signed)
Pt c/o medication issue:  1. Name of Medication:   empagliflozin (JARDIANCE) 10 MG TABS tablet    2. How are you currently taking this medication (dosage and times per day)?    3. Are you having a reaction (difficulty breathing--STAT)? No   4. What is your medication issue? Patient hasn't been approve of this medication through her insurance. Calling to see what her options are there. Please advise

## 2023-07-24 ENCOUNTER — Telehealth: Payer: Self-pay | Admitting: Cardiology

## 2023-07-24 NOTE — Telephone Encounter (Signed)
Patient called reported she was given Jardiance after her Heart Cath procedure along with Metoprolol Succinate. Patient has just started taking medication (Jardiance) and stated that after 30 minutes to an hour her lower back started itching. Stated that she hadn't changed any washing detergent. Rash is red small bumps(red). Has taking Benadryl, applied alcohol to the area and also used Cortisone cream as well.  Patient is scheduled for an [facility] appointment with Sharlene Dory on 08/16. Also has an appointment with Dr. Jenene Slicker on 09/12. Seen Dr. Wyline Mood in the past. Forwarded to Dr. Wyline Mood DOD as Kelsey Padilla is out of the office today. Please Advise.

## 2023-07-24 NOTE — Telephone Encounter (Signed)
Pt c/o medication issue:  1. Name of Medication:   empagliflozin (JARDIANCE) 10 MG TABS tablet    2. How are you currently taking this medication (dosage and times per day)? Take 1 tablet (10 mg total) by mouth daily before breakfast.   3. Are you having a reaction (difficulty breathing--STAT)? No  4. What is your medication issue? Pt states that since starting medication yesterday her back has been itching really bad. Pt would like a callback regarding this matter. Please advise

## 2023-07-25 NOTE — Telephone Encounter (Signed)
Patient is calling to follow up. Patient stated that she has stopped taking the medication.

## 2023-07-26 NOTE — Telephone Encounter (Signed)
Spoke to patient who verbalized understanding. Patient stated that she would see a provider at [facility] as she cannot get in to see her PCP at this time since it is the health department. Pt stated she would no longer use alcohol wipes. Pt stated she has been using hydrocortisone cream, aloe vera, and benadryl to help with the itching. Pt had no further questions or concerns at this time.

## 2023-07-31 ENCOUNTER — Telehealth: Payer: Self-pay | Admitting: Cardiology

## 2023-07-31 NOTE — Telephone Encounter (Signed)
Advised that plain loratadine and plain tylenol is recommended and safe as long as she doesn't go over the recommended dosage. Verbalized understanding of plan.

## 2023-07-31 NOTE — Telephone Encounter (Signed)
Pt c/o medication issue:  1. Name of Medication: Allergy medication and pain medication   2. How are you currently taking this medication (dosage and times per day)? No currently taking   3. Are you having a reaction (difficulty breathing--STAT)?   4. What is your medication issue? Patient is requesting call back to discuss what medications she can take for allergies and pain medication for tooth after having heart attack.

## 2023-08-02 ENCOUNTER — Ambulatory Visit: Payer: Medicaid Other | Attending: Nurse Practitioner | Admitting: Nurse Practitioner

## 2023-08-02 ENCOUNTER — Encounter: Payer: Self-pay | Admitting: Nurse Practitioner

## 2023-08-02 VITALS — BP 128/84 | HR 95 | Ht 63.0 in | Wt 145.2 lb

## 2023-08-02 DIAGNOSIS — Z79899 Other long term (current) drug therapy: Secondary | ICD-10-CM | POA: Diagnosis not present

## 2023-08-02 DIAGNOSIS — I255 Ischemic cardiomyopathy: Secondary | ICD-10-CM

## 2023-08-02 DIAGNOSIS — I10 Essential (primary) hypertension: Secondary | ICD-10-CM

## 2023-08-02 DIAGNOSIS — I2510 Atherosclerotic heart disease of native coronary artery without angina pectoris: Secondary | ICD-10-CM

## 2023-08-02 DIAGNOSIS — I739 Peripheral vascular disease, unspecified: Secondary | ICD-10-CM

## 2023-08-02 DIAGNOSIS — I252 Old myocardial infarction: Secondary | ICD-10-CM | POA: Diagnosis not present

## 2023-08-02 DIAGNOSIS — I38 Endocarditis, valve unspecified: Secondary | ICD-10-CM

## 2023-08-02 DIAGNOSIS — I6529 Occlusion and stenosis of unspecified carotid artery: Secondary | ICD-10-CM

## 2023-08-02 DIAGNOSIS — I5022 Chronic systolic (congestive) heart failure: Secondary | ICD-10-CM

## 2023-08-02 DIAGNOSIS — E785 Hyperlipidemia, unspecified: Secondary | ICD-10-CM

## 2023-08-02 DIAGNOSIS — Z720 Tobacco use: Secondary | ICD-10-CM

## 2023-08-02 MED ORDER — NITROGLYCERIN 0.4 MG SL SUBL: 0.4000 mg | SUBLINGUAL_TABLET | SUBLINGUAL | 3 refills | Status: DC | PRN

## 2023-08-02 MED ORDER — METOPROLOL SUCCINATE ER 25 MG PO TB24: 12.5000 mg | ORAL_TABLET | Freq: Every day | ORAL | 1 refills | Status: DC

## 2023-08-02 NOTE — Patient Instructions (Addendum)
Medication Instructions:   Nitroglycerin refilled today  Decrease Toprol to 12.5mg  daily  Continue all other medications.     Labwork:  BMET - order given Please do in 1 week   Testing/Procedures:  Your physician has requested that you have a carotid duplex. This test is an ultrasound of the carotid arteries in your neck. It looks at blood flow through these arteries that supply the brain with blood. Allow one hour for this exam. There are no restrictions or special instructions.  Follow-Up:  Office will contact with results via phone, letter or mychart.    2-3 months    Any Other Special Instructions Will Be Listed Below (If Applicable).   If you need a refill on your cardiac medications before your next appointment, please call your pharmacy.

## 2023-08-02 NOTE — Progress Notes (Unsigned)
Cardiology Office Note:  .   Date: 08/02/2023 ID:  [name], DOB Jan 13, 1966, MRN [id number] PCP: [facility]  [facility] Providers Cardiologist:  Dina Rich, MD    History of Present Illness: .   Kelsey Padilla is a 57 y.o. female with a PMH of CAD, s/p CABG in 2012 (LIMA-LAD, SVG-OM, SVG-RPDA) and subsequent PCI in 2019 (SVG to OM distal graft), HTN, T2DM, HLD, vitamin B12 deficiency, tobacco abuse, and HFmrEF, who presents today for hospital follow-up.   Last seen by Dr. Dina Rich in 2019. Was lost in follow-up after this d/t insurance issues.  Recent hospital admission for NSTEMI. EKG negative for acute ST/T wave changes, did show inferior Q waves.  Troponins slightly elevated, flat.  Underwent complex PCI of distal left main into the ostial left circumflex and required multiple wires, cutting balloon angioplasty, shockwave lithotripsy, and drug-eluting stent.  Recommended to continue aspirin and Brilinta for 12 months.  Was referred to the lipid clinic.  Today she presents for hospital follow-up.  She states she is doing well since hospital discharge. Could not tolerate Jardiance as this caused hives, medication has been listed under her allergies and only taking Lopressor 12.5 mg daily instead of Toprol XL. Denies any chest pain, shortness of breath, palpitations, syncope, presyncope, dizziness, orthopnea, PND, swelling or significant weight changes, acute bleeding, or claudication.  Studies Reviewed: .    Echo 07/2023:   1. Left ventricular ejection fraction, by estimation, is 40 to 45%. The  left ventricle has mildly decreased function. The left ventricle  demonstrates regional wall motion abnormalities with akinesis of the basal to mid inferior and the basal inferoseptal wall segments, akinesis of the basal anterolateral wall, severe hypokinesis of the basal to mid inferolateral wall. There is mild concentric left ventricular hypertrophy.  Left ventricular diastolic parameters are consistent with Grade I diastolic dysfunction (impaired relaxation).   2. Right ventricular systolic function is mildly reduced. The right ventricular size is normal. Tricuspid regurgitation signal is inadequate for assessing PA pressure.   3. The mitral valve is normal in structure. Trivial mitral valve  regurgitation. No evidence of mitral stenosis.   4. The aortic valve is tricuspid. There is mild calcification of the  aortic valve. Aortic valve regurgitation is not visualized. No aortic  stenosis is present.   5. The inferior vena cava is normal in size with greater than 50%  respiratory variability, suggesting right atrial pressure of 3 mmHg.   LHC 07/2023:    Mid LM to Ost LAD lesion is 95% stenosed.   Prox LAD to Mid LAD lesion is 100% stenosed.   Prox LAD lesion is 50% stenosed.   Prox RCA to Mid RCA lesion is 75% stenosed.   Dist RCA lesion is 100% stenosed.   Origin lesion is 100% stenosed.   Ost 2nd Mrg lesion is 100% stenosed.   Origin lesion is 100% stenosed.   Ost Cx lesion is 75% stenosed.   Non-stenotic Dist Graft to Insertion lesion was previously treated.   A stent was successfully placed.   A stent was successfully placed.   Post intervention, there is a 0% residual stenosis.   Post intervention, there is a 0% residual stenosis.   1.  Patent LIMA to LAD with occluded vein graft to obtuse marginal and occluded vein graft to PDA. 2.  Severe native vessel disease with chronic total occlusion of right coronary artery and LAD. 3.  Given the occluded vein graft  to obtuse marginal, the left circumflex remains unrevascularized and give collaterals to the right coronary artery; for this reason complex PCI of the distal left main into the ostial left circumflex was pursued requiring multiple wires, Cutting Balloon angioplasty, Shockwave lithotripsy, and drug-eluting stent x 1. 4.  LVEDP of 18 mmHg. 5.  Poor left radial pulse  necessitating femoral approach. 6.  Successful Perclose management of right femoral arteriotomy.   Recommendation: Dual antiplatelet therapy for at least 1 year and aggressive cardiac risk factor management.   Carotid duplex 09/2019: Summary:  Right Carotid: Velocities in the right ICA are consistent with a 40-59% stenosis. Hemodynamically significant plaque >50% visualized in the CCA. The ECA appears >50% stenosed.   Left Carotid: Velocities in the left ICA are consistent with a 40-59%  stenosis. Hemodynamically significant plaque >50% visualized in the  CCA. The ECA appears >50% stenosed.   Vertebrals:  Bilateral vertebral arteries demonstrate antegrade flow.  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   *See table(s) above for measurements and observations.  Suggest follow up study in 12 months.  Physical Exam:   VS:  BP 128/84   Pulse 95   Ht 5\' 3"  (1.6 m)   Wt 145 lb 3.2 oz (65.9 kg)   SpO2 98%   BMI 25.72 kg/m    Wt Readings from Last 3 Encounters:  08/02/23 145 lb 3.2 oz (65.9 kg)  07/17/23 154 lb 5.2 oz (70 kg)  07/20/18 155 lb (70.3 kg)    GEN: Well nourished, well developed in no acute distress NECK: No JVD; No carotid bruits CARDIAC: S1/S2, RRR, no murmurs, rubs, gallops RESPIRATORY:  Clear to auscultation without rales, wheezing or rhonchi  ABDOMEN: Soft, non-tender, non-distended EXTREMITIES:  No edema; No deformity   ASSESSMENT AND PLAN: .    CAD, s/p CABG in 2012, subsequent PCI in 2019 with recent complex PCI (2024), medication management Underwent recent complex PCI of distal left main into the ostial left circumflex and required multiple wires, cutting balloon angioplasty, shockwave lithotripsy, and drug-eluting stent of distal left main into ostial left circumflex. Recommended to continue aspirin and Brilinta for 12 months. Continue Aspirin, fenofibrate, simvastatin, brlinita, and instructed pt to start Toprol XL 12.5 mg daily and stop  Lopressor 12.5 daily. Heart healthy diet and regular cardiovascular exercise encouraged. Will refill NTG. Will obtain BMET as previously ordered by Yvonna Alanis, PA-C. Once GDMT is optimally titrated, plan to update Echo to evaluate EF.  2. HFmrEF, ICM Stage C, NYHA class I symptoms. TTE EF 40-45%. Euvolemic and well compensated on exam. Could not tolerate Jardiance, want to avoid SGLT2i. Continue Aldactone and instructed to start Toprol XL 12.5 mg daily and stop Lopressor 12.5 mg daily. Current BP does not allow further GDMT titration. Low sodium diet, fluid restriction <2L, and daily weights encouraged. Educated to contact our office for weight gain of 2 lbs overnight or 5 lbs in one week.   3. HTN BP stable. SBP goal < 130. Discussed to monitor BP at home at least 2 hours after medications and sitting for 5-10 minutes. Continue Aldactone and instructed to start Toprol XL 12.5 mg daily and stop Lopressor 12.5 mg daily.   4. Carotid artery disease, PAD, HLD Carotid doppler 09/2019 revealed BICA stenosis at 40-59%, bilateral subclavian artery flow was disturbed. Asymptomatic. Continue aspirin and simvastatin. Will repeat carotid doppler at this time. Follow-up with Lipid Clinic as scheduled.   5. Tobacco abuse Smoking cessation encouraged and discussed.   Dispo: Follow-up  with me/APP in 2-3 months or sooner if anything changes.   Signed, Sharlene Dory, NP

## 2023-08-08 ENCOUNTER — Other Ambulatory Visit (HOSPITAL_COMMUNITY)
Admission: RE | Admit: 2023-08-08 | Discharge: 2023-08-08 | Disposition: A | Discharge status: Home / Self Care | Payer: Medicaid Other | Source: Ambulatory Visit | Attending: Nurse Practitioner | Admitting: Nurse Practitioner

## 2023-08-08 ENCOUNTER — Telehealth: Payer: Self-pay

## 2023-08-08 ENCOUNTER — Telehealth: Payer: Self-pay | Admitting: Cardiology

## 2023-08-08 DIAGNOSIS — I5020 Unspecified systolic (congestive) heart failure: Secondary | ICD-10-CM

## 2023-08-08 DIAGNOSIS — I5022 Chronic systolic (congestive) heart failure: Secondary | ICD-10-CM | POA: Diagnosis present

## 2023-08-08 DIAGNOSIS — Z79899 Other long term (current) drug therapy: Secondary | ICD-10-CM

## 2023-08-08 LAB — BASIC METABOLIC PANEL
Anion gap: 7 (ref 5–15)
BUN: 18 mg/dL (ref 6–20)
CO2: 25 mmol/L (ref 22–32)
Calcium: 9.3 mg/dL (ref 8.9–10.3)
Chloride: 102 mmol/L (ref 98–111)
Creatinine, Ser: 0.92 mg/dL (ref 0.44–1.00)
GFR, Estimated: >60 mL/min (ref >60)
Glucose, Bld: 86 mg/dL (ref 70–99)
Potassium: 4.1 mmol/L (ref 3.5–5.1)
Sodium: 134 mmol/L — ABNORMAL LOW (ref 135–145)

## 2023-08-08 NOTE — Telephone Encounter (Signed)
Left detailed message per DPR and sent PCP copy

## 2023-08-08 NOTE — Telephone Encounter (Signed)
-----   Message from Sharlene Dory sent at 08/08/2023 12:27 PM EDT ----- Labs look good. Follow-up with me as scheduled.   Thanks!  Sharlene Dory, AGNP-C

## 2023-08-08 NOTE — Telephone Encounter (Signed)
Patient is returning phone call in regards to lab results.

## 2023-08-09 ENCOUNTER — Ambulatory Visit: Payer: Medicaid Other | Attending: Interventional Cardiology | Admitting: Pharmacist

## 2023-08-09 DIAGNOSIS — E782 Mixed hyperlipidemia: Secondary | ICD-10-CM | POA: Diagnosis not present

## 2023-08-09 DIAGNOSIS — I2510 Atherosclerotic heart disease of native coronary artery without angina pectoris: Secondary | ICD-10-CM

## 2023-08-09 NOTE — Telephone Encounter (Signed)
Explained Lab Results to patient she verbalized understanding of lab results per E. Philis Nettle

## 2023-08-09 NOTE — Assessment & Plan Note (Addendum)
Assessment: LDL-C is significantly elevated above goal of less than 55 LDL-C is 173 on simvastatin 40 mg daily and tolerates well She has not tolerated rosuvastatin or atorvastatin due to muscle pains No formal exercise but does states she plans to start walking soon No interest in quitting smoking Under a lot of stress but does report that she has a good support system Diet is full of vegetables and fiber Aware to keep sugar from drinks low  Plan: Will submit prior authorization for Repatha Continue simvastatin 40 mg daily May need to add Zetia if Repatha does not get to goal Patient has allergy to latex, is low risk (itching with latex gloves) and patient would like to continue with Repatha due to the unlikely event that she have a reaction to it

## 2023-08-09 NOTE — Progress Notes (Signed)
Patient ID: Kelsey Padilla                 DOB: 1966-05-02                    MRN: [id number]      HPI: Kelsey Padilla is a 57 y.o. female patient referred to lipid clinic by  Dr. Wyline Mood. PMH is significant for CAD, s/p CABG in 2012 (LIMA-LAD, SVG-OM, SVG-RPDA) and subsequent PCI in 2019 (SVG to OM distal graft), HTN, T2DM, HLD, vitamin B12 deficiency, tobacco abuse, and HFmrEF. Previously lost to f/u due to insurance issues.  Recent hospital admission for NSTEMI. EKG negative for acute ST/T wave changes, did show inferior Q waves.  Troponins slightly elevated, flat.  Underwent complex PCI of distal left main into the ostial left circumflex and required multiple wires, cutting balloon angioplasty, shockwave lithotripsy, and drug-eluting stent.  Recommended to continue aspirin and Brilinta for 12 months.  Was referred to the lipid clinic.    Patient presents today to lipid clinic accompanied by her aunt.  Patient now has insurance under Medicaid.  She was previously on Zetia and tolerated this fine however she has been off of it for about 3 or 4 months since she could not afford it without insurance.  She reports that her LDL cholesterol with Zetia and simvastatin 40 mg was 123.  She reports being under a great deal of stress with her husband who is bipolar but refuses treatment, her autistic son and her autistic 68-year-old grandson.  No formal exercise but states she is busy taking care of her grandson.  Plans to walk once it gets cooler.  She is still smoking and does not have any interest in quitting.  Smokes about 6 cigarettes a day depending on how her day is going.  Has not drank alcohol in about 2 years.   Reviewed options for lowering LDL cholesterol including PCSK-9 inhibitors.  Discussed mechanisms of action, dosing, side effects and potential decreases in LDL cholesterol.  Patient's cousins are on Repatha and they tolerated well.  Patient is interested in trying this medication.  She does have  a reported allergy to latex, however this appears to be itching and dry skin to latex gloves.  We did discuss that we could do Praluent instead however patient would like to go with Repatha since the latex is not in the drug but only in the That covers the needle.  Current Medications: simvastatin 40mg  daily, fenofibrate 145mg  daily Intolerances: rosuvastatin 20mg , atorvastatin 40mg  (muscle pain) Risk Factors: progressive, premature CAD LDL-C goal: <55 ApoB goal: <70  Diet:  Breakfast: coffee Lunch: baked chicken, salads, chili beans Dinner: venison, vegetables, little beef Drink: water, small amount of mountain dew, lemonade, coffee 1sp sugar, 1% milk  Exercise: plans to start walking, watches 10 year old grandson  Family History:  Family History  Problem Relation Age of Onset   Cancer Mother    Heart disease Mother    Diabetes Mother    Heart failure Mother    Cancer Father    High blood pressure Father   ,  Social History: 6-7 cigarettes per day, very rare ETOH  Labs: Lipid Panel     Component Value Date/Time   CHOL 237 (H) 07/18/2023 0118   TRIG 102 07/18/2023 0118   HDL 44 07/18/2023 0118   CHOLHDL 5.4 07/18/2023 0118   VLDL 20 07/18/2023 0118   LDLCALC 173 (H) 07/18/2023 0118    Past  Medical History:  Diagnosis Date   Anemia, macrocytic    Community acquired pneumonia 03/01/2018   Coronary artery disease    Hyperlipidemia    S/P CABG x 3 08/16/2011   BARTLE. LIMA to LAD, SVG to OM, SVG to RPDA   Tobacco abuse    Vitamin B12 deficiency     Current Outpatient Medications on File Prior to Visit  Medication Sig Dispense Refill   aspirin 81 MG chewable tablet Chew 1 tablet (81 mg total) by mouth daily.     Cholecalciferol (VITAMIN D3) 1.25 MG (50000 UT) TABS Take 1 capsule by mouth daily.     Cyanocobalamin (VITAMIN B-12) 5000 MCG LOZG Take 5,000 mcg by mouth once a week.     fenofibrate (TRICOR) 145 MG tablet Take 145 mg by mouth at bedtime.     metFORMIN  (GLUCOPHAGE) 500 MG tablet Take 500 mg by mouth at bedtime.     metoprolol succinate (TOPROL XL) 25 MG 24 hr tablet Take 0.5 tablets (12.5 mg total) by mouth daily. 45 tablet 1   nitroGLYCERIN (NITROSTAT) 0.4 MG SL tablet Place 1 tablet (0.4 mg total) under the tongue every 5 (five) minutes as needed. 25 tablet 3   simvastatin (ZOCOR) 40 MG tablet TAKE 1 TABLET BY MOUTH EVERY DAY (Patient taking differently: Take 40 mg by mouth at bedtime.) 90 tablet 3   spironolactone (ALDACTONE) 25 MG tablet Take 0.5 tablets (12.5 mg total) by mouth daily. 30 tablet 4   ticagrelor (BRILINTA) 90 MG TABS tablet Take 1 tablet (90 mg total) by mouth 2 (two) times daily. 60 tablet 10   acetaminophen (TYLENOL) 500 MG tablet Take 1,000 mg by mouth 2 (two) times daily as needed for moderate pain.     diphenhydrAMINE (BENADRYL) 25 MG tablet Take 25 mg by mouth every 6 (six) hours as needed for allergies.     No current facility-administered medications on file prior to visit.    Allergies  Allergen Reactions   Jardiance [Empagliflozin] Hives   Codeine Nausea And Vomiting   Latex Itching   Other Nausea And Vomiting    Artificial sweetner   Statins     Patient says cannot tolerate statins d/t muscle cramping.  She is taking Simvastatin 40mg  PTA, reports she can tolerate simvastatin better.     Assessment/Plan:  1. Hyperlipidemia -  Hyperlipidemia Assessment: LDL-C is significantly elevated above goal of less than 55 LDL-C is 173 on simvastatin 40 mg daily and tolerates well She has not tolerated rosuvastatin or atorvastatin due to muscle pains No formal exercise but does states she plans to start walking soon No interest in quitting smoking Under a lot of stress but does report that she has a good support system Diet is full of vegetables and fiber Aware to keep sugar from drinks low  Plan: Will submit prior authorization for Repatha Continue simvastatin 40 mg daily May need to add Zetia if Repatha  does not get to goal Patient has allergy to latex, is low risk (itching with latex gloves) and patient would like to continue with Repatha due to the unlikely event that she have a reaction to it    Thank you,  Olene Floss, Pharm.D, BCACP, BCPS, CPP [facility] [address], [place], [place] [address]  Phone: [phone]; Fax: [phone]

## 2023-08-09 NOTE — Patient Instructions (Signed)
I will submit a prior authorization for Repatha. I will call you once I hear back. Please call me at [phone] with any questions.   Repatha is a cholesterol medication that improved your body's ability to get rid of "bad cholesterol" known as LDL. It can lower your LDL up to 60%! It is an injection that is given under the skin every 2 weeks. The medication often requires a prior authorization from your insurance company. We will take care of submitting all the necessary information to your insurance company to get it approved. The most common side effects of Repatha include runny nose, symptoms of the common cold, rarely flu or flu-like symptoms, back/muscle pain in about 3-4% of the patients, and redness, pain, or bruising at the injection site. Tell your healthcare provider if you have any side effect that bothers you or that does not go away.   Continue taking simvastatin 40mg  daily

## 2023-08-13 ENCOUNTER — Telehealth: Payer: Self-pay

## 2023-08-13 ENCOUNTER — Other Ambulatory Visit (HOSPITAL_COMMUNITY): Payer: Self-pay

## 2023-08-13 DIAGNOSIS — E782 Mixed hyperlipidemia: Secondary | ICD-10-CM

## 2023-08-13 NOTE — Telephone Encounter (Signed)
Pharmacy Patient Advocate Encounter   Received notification from Physician's Office/PHARMD MACCIA  that prior authorization for REPATHA is required/requested.   Insurance verification completed.   The patient is insured through [facility] [place] .   Per test claim: PA required; PA submitted to [facility] [place] Medicaid via CoverMyMeds Key/confirmation #/[id number]   Status is pending

## 2023-08-15 MED ORDER — REPATHA SURECLICK 140 MG/ML ~~LOC~~ SOAJ: 1.0000 mL | SUBCUTANEOUS | 3 refills | Status: DC

## 2023-08-15 NOTE — Addendum Note (Signed)
Addended by: Malena Peer D on: 08/15/2023 03:51 PM   Modules accepted: Orders

## 2023-08-15 NOTE — Telephone Encounter (Signed)
PA approved through 12/16/98 Pt made aware.  Rx sent to [facility] Pt will go to [facility] for labs in 2-3 months.

## 2023-08-22 ENCOUNTER — Telehealth: Payer: Self-pay | Admitting: Pharmacist

## 2023-08-22 ENCOUNTER — Ambulatory Visit: Payer: Medicaid Other | Attending: Nurse Practitioner

## 2023-08-22 DIAGNOSIS — I6523 Occlusion and stenosis of bilateral carotid arteries: Secondary | ICD-10-CM | POA: Diagnosis not present

## 2023-08-22 DIAGNOSIS — I6529 Occlusion and stenosis of unspecified carotid artery: Secondary | ICD-10-CM

## 2023-08-22 MED ORDER — REPATHA SURECLICK 140 MG/ML ~~LOC~~ SOAJ: 140.0000 mg | SUBCUTANEOUS | 0 refills | Status: DC

## 2023-08-22 NOTE — Telephone Encounter (Signed)
Received fax from [facility] to replace 1 Repatha pen. Rx has been sent in.

## 2023-08-29 ENCOUNTER — Ambulatory Visit: Payer: Medicaid Other | Admitting: Internal Medicine

## 2023-10-01 ENCOUNTER — Telehealth: Payer: Self-pay | Admitting: Pharmacist

## 2023-10-01 ENCOUNTER — Other Ambulatory Visit (HOSPITAL_COMMUNITY)
Admission: RE | Admit: 2023-10-01 | Discharge: 2023-10-01 | Disposition: A | Discharge status: Home / Self Care | Payer: Medicaid Other | Source: Ambulatory Visit | Attending: Cardiology | Admitting: Cardiology

## 2023-10-01 DIAGNOSIS — E782 Mixed hyperlipidemia: Secondary | ICD-10-CM | POA: Insufficient documentation

## 2023-10-01 LAB — LIPID PANEL
Cholesterol: 121 mg/dL (ref 0–200)
HDL: 45 mg/dL (ref >40)
LDL Cholesterol: 54 mg/dL (ref 0–99)
Total CHOL/HDL Ratio: 2.7 {ratio}
Triglycerides: 110 mg/dL (ref <150)
VLDL: 22 mg/dL (ref 0–40)

## 2023-10-01 NOTE — Telephone Encounter (Signed)
Reviewed results with patient. She as very happy with them as am I. Continue Repatha and simvastatin.

## 2023-10-07 ENCOUNTER — Other Ambulatory Visit: Payer: Self-pay | Admitting: Nurse Practitioner

## 2023-10-07 ENCOUNTER — Telehealth: Payer: Self-pay | Admitting: Cardiology

## 2023-10-07 DIAGNOSIS — I6523 Occlusion and stenosis of bilateral carotid arteries: Secondary | ICD-10-CM

## 2023-10-07 MED ORDER — FENOFIBRATE 145 MG PO TABS: 145.0000 mg | ORAL_TABLET | Freq: Every day | ORAL | 2 refills | Status: DC

## 2023-10-07 NOTE — Telephone Encounter (Signed)
*  STAT* If patient is at the pharmacy, call can be transferred to refill team.   1. Which medications need to be refilled? (please list name of each medication and dose if known) fenofibrate (TRICOR) 145 MG tablet   2. Would you like to learn more about the convenience, safety, & potential cost savings by using the [facility]? No   3. Are you open to using the [facility] No  4. Which pharmacy/location (including street and city if local pharmacy) is medication to be sent to?[facility] - [place], [place] - [address]   5. Do they need a 30 day or 90 day supply? 90 Day Supply

## 2023-10-07 NOTE — Telephone Encounter (Signed)
filled

## 2023-10-10 ENCOUNTER — Ambulatory Visit: Payer: Medicaid Other | Admitting: Nurse Practitioner

## 2023-11-12 ENCOUNTER — Encounter: Payer: Self-pay | Admitting: Nurse Practitioner

## 2023-11-12 ENCOUNTER — Telehealth: Payer: Self-pay | Admitting: Nurse Practitioner

## 2023-11-12 ENCOUNTER — Ambulatory Visit: Payer: Medicaid Other | Attending: Nurse Practitioner | Admitting: Nurse Practitioner

## 2023-11-12 VITALS — BP 118/80 | HR 73 | Ht 63.0 in | Wt 143.4 lb

## 2023-11-12 DIAGNOSIS — I5022 Chronic systolic (congestive) heart failure: Secondary | ICD-10-CM

## 2023-11-12 DIAGNOSIS — I2510 Atherosclerotic heart disease of native coronary artery without angina pectoris: Secondary | ICD-10-CM

## 2023-11-12 DIAGNOSIS — Z720 Tobacco use: Secondary | ICD-10-CM

## 2023-11-12 DIAGNOSIS — I255 Ischemic cardiomyopathy: Secondary | ICD-10-CM

## 2023-11-12 DIAGNOSIS — I10 Essential (primary) hypertension: Secondary | ICD-10-CM

## 2023-11-12 DIAGNOSIS — E785 Hyperlipidemia, unspecified: Secondary | ICD-10-CM

## 2023-11-12 DIAGNOSIS — I739 Peripheral vascular disease, unspecified: Secondary | ICD-10-CM

## 2023-11-12 DIAGNOSIS — I6523 Occlusion and stenosis of bilateral carotid arteries: Secondary | ICD-10-CM

## 2023-11-12 MED ORDER — SPIRONOLACTONE 25 MG PO TABS: 12.5000 mg | ORAL_TABLET | Freq: Every day | ORAL | 4 refills | Status: DC

## 2023-11-12 MED ORDER — REPATHA SURECLICK 140 MG/ML ~~LOC~~ SOAJ: 1.0000 mL | SUBCUTANEOUS | 3 refills | Status: DC

## 2023-11-12 MED ORDER — METOPROLOL SUCCINATE ER 25 MG PO TB24: 12.5000 mg | ORAL_TABLET | Freq: Two times a day (BID) | ORAL | 1 refills | Status: DC

## 2023-11-12 MED ORDER — SIMVASTATIN 40 MG PO TABS: 40.0000 mg | ORAL_TABLET | Freq: Every day | ORAL | 1 refills | Status: DC

## 2023-11-12 MED ORDER — FENOFIBRATE 145 MG PO TABS: 145.0000 mg | ORAL_TABLET | Freq: Every day | ORAL | 2 refills | Status: DC

## 2023-11-12 MED ORDER — METFORMIN HCL 500 MG PO TABS: 500.0000 mg | ORAL_TABLET | Freq: Every day | ORAL | 0 refills | Status: DC

## 2023-11-12 NOTE — Telephone Encounter (Signed)
Pharmacy would like a verbal for   Evolocumab (REPATHA SURECLICK) 140 MG/ML SOAJ   Because it was damaged [facility] [phone]  Ref# [id number]

## 2023-11-12 NOTE — Progress Notes (Unsigned)
Cardiology Office Note:  .   Date: 08/02/2023 ID:  Kelsey Padilla, DOB 1966-09-15, MRN [id number] PCP: Health, [facility]  [facility] Providers Cardiologist:  Dina Rich, MD    History of Present Illness: .   Kelsey Padilla is a 57 y.o. female with a PMH of CAD, s/p CABG in 2012 (LIMA-LAD, SVG-OM, SVG-RPDA) and subsequent PCI in 2019 (SVG to OM distal graft), HTN, T2DM, HLD, vitamin B12 deficiency, tobacco abuse, and HFmrEF, who presents today for hospital follow-up.   Last seen by Dr. Dina Rich in 2019. Was lost in follow-up after this d/t insurance issues.  Recent hospital admission for NSTEMI. EKG negative for acute ST/T wave changes, did show inferior Q waves.  Troponins slightly elevated, flat.  Underwent complex PCI of distal left main into the ostial left circumflex and required multiple wires, cutting balloon angioplasty, shockwave lithotripsy, and drug-eluting stent.  Recommended to continue aspirin and Brilinta for 12 months.  Was referred to the lipid clinic.  Today she presents for hospital follow-up.  She states she is doing well since hospital discharge. Could not tolerate Jardiance as this caused hives, medication has been listed under her allergies and only taking Lopressor 12.5 mg daily instead of Toprol XL. Denies any chest pain, shortness of breath, palpitations, syncope, presyncope, dizziness, orthopnea, PND, swelling or significant weight changes, acute bleeding, or claudication.  Studies Reviewed: .    Echo 07/2023:   1. Left ventricular ejection fraction, by estimation, is 40 to 45%. The  left ventricle has mildly decreased function. The left ventricle  demonstrates regional wall motion abnormalities with akinesis of the basal to mid inferior and the basal inferoseptal wall segments, akinesis of the basal anterolateral wall, severe hypokinesis of the basal to mid inferolateral wall. There is mild concentric left ventricular hypertrophy.  Left ventricular diastolic parameters are consistent with Grade I diastolic dysfunction (impaired relaxation).   2. Right ventricular systolic function is mildly reduced. The right ventricular size is normal. Tricuspid regurgitation signal is inadequate for assessing PA pressure.   3. The mitral valve is normal in structure. Trivial mitral valve  regurgitation. No evidence of mitral stenosis.   4. The aortic valve is tricuspid. There is mild calcification of the  aortic valve. Aortic valve regurgitation is not visualized. No aortic  stenosis is present.   5. The inferior vena cava is normal in size with greater than 50%  respiratory variability, suggesting right atrial pressure of 3 mmHg.   LHC 07/2023:    Mid LM to Ost LAD lesion is 95% stenosed.   Prox LAD to Mid LAD lesion is 100% stenosed.   Prox LAD lesion is 50% stenosed.   Prox RCA to Mid RCA lesion is 75% stenosed.   Dist RCA lesion is 100% stenosed.   Origin lesion is 100% stenosed.   Ost 2nd Mrg lesion is 100% stenosed.   Origin lesion is 100% stenosed.   Ost Cx lesion is 75% stenosed.   Non-stenotic Dist Graft to Insertion lesion was previously treated.   A stent was successfully placed.   A stent was successfully placed.   Post intervention, there is a 0% residual stenosis.   Post intervention, there is a 0% residual stenosis.   1.  Patent LIMA to LAD with occluded vein graft to obtuse marginal and occluded vein graft to PDA. 2.  Severe native vessel disease with chronic total occlusion of right coronary artery and LAD. 3.  Given the occluded vein graft  to obtuse marginal, the left circumflex remains unrevascularized and give collaterals to the right coronary artery; for this reason complex PCI of the distal left main into the ostial left circumflex was pursued requiring multiple wires, Cutting Balloon angioplasty, Shockwave lithotripsy, and drug-eluting stent x 1. 4.  LVEDP of 18 mmHg. 5.  Poor left radial pulse  necessitating femoral approach. 6.  Successful Perclose management of right femoral arteriotomy.   Recommendation: Dual antiplatelet therapy for at least 1 year and aggressive cardiac risk factor management.   Carotid duplex 09/2019: Summary:  Right Carotid: Velocities in the right ICA are consistent with a 40-59% stenosis. Hemodynamically significant plaque >50% visualized in the CCA. The ECA appears >50% stenosed.   Left Carotid: Velocities in the left ICA are consistent with a 40-59%  stenosis. Hemodynamically significant plaque >50% visualized in the  CCA. The ECA appears >50% stenosed.   Vertebrals:  Bilateral vertebral arteries demonstrate antegrade flow.  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   *See table(s) above for measurements and observations.  Suggest follow up study in 12 months.  Physical Exam:   VS:  There were no vitals taken for this visit.   Wt Readings from Last 3 Encounters:  08/02/23 145 lb 3.2 oz (65.9 kg)  07/17/23 154 lb 5.2 oz (70 kg)  07/20/18 155 lb (70.3 kg)    GEN: Well nourished, well developed in no acute distress NECK: No JVD; No carotid bruits CARDIAC: S1/S2, RRR, no murmurs, rubs, gallops RESPIRATORY:  Clear to auscultation without rales, wheezing or rhonchi  ABDOMEN: Soft, non-tender, non-distended EXTREMITIES:  No edema; No deformity   ASSESSMENT AND PLAN: .    CAD, s/p CABG in 2012, subsequent PCI in 2019 with recent complex PCI (2024), medication management Underwent recent complex PCI of distal left main into the ostial left circumflex and required multiple wires, cutting balloon angioplasty, shockwave lithotripsy, and drug-eluting stent of distal left main into ostial left circumflex. Recommended to continue aspirin and Brilinta for 12 months. Continue Aspirin, fenofibrate, simvastatin, brlinita, and instructed pt to start Toprol XL 12.5 mg daily and stop Lopressor 12.5 daily. Heart healthy diet and regular  cardiovascular exercise encouraged. Will refill NTG. Will obtain BMET as previously ordered by Yvonna Alanis, PA-C. Once GDMT is optimally titrated, plan to update Echo to evaluate EF.  2. HFmrEF, ICM Stage C, NYHA class I symptoms. TTE EF 40-45%. Euvolemic and well compensated on exam. Could not tolerate Jardiance, want to avoid SGLT2i. Continue Aldactone and instructed to start Toprol XL 12.5 mg daily and stop Lopressor 12.5 mg daily. Current BP does not allow further GDMT titration. Low sodium diet, fluid restriction <2L, and daily weights encouraged. Educated to contact our office for weight gain of 2 lbs overnight or 5 lbs in one week.   3. HTN BP stable. SBP goal < 130. Discussed to monitor BP at home at least 2 hours after medications and sitting for 5-10 minutes. Continue Aldactone and instructed to start Toprol XL 12.5 mg daily and stop Lopressor 12.5 mg daily.   4. Carotid artery disease, PAD, HLD Carotid doppler 09/2019 revealed BICA stenosis at 40-59%, bilateral subclavian artery flow was disturbed. Asymptomatic. Continue aspirin and simvastatin. Will repeat carotid doppler at this time. Follow-up with Lipid Clinic as scheduled.   5. Tobacco abuse Smoking cessation encouraged and discussed.     Dispo: Follow-up with me/APP in 2-3 months or sooner if anything changes.   Signed, Sharlene Dory, NP

## 2023-11-12 NOTE — Patient Instructions (Addendum)
Medication Instructions:  Your physician recommends that you continue on your current medications as directed. Please refer to the Current Medication list given to you today.  Labwork: None   Testing/Procedures: Your physician has requested that you have an echocardiogram. Echocardiography is a painless test that uses sound waves to create images of your heart. It provides your doctor with information about the size and shape of your heart and how well your heart's chambers and valves are working. This procedure takes approximately one hour. There are no restrictions for this procedure. Please do NOT wear cologne, perfume, aftershave, or lotions (deodorant is allowed). Please arrive 15 minutes prior to your appointment time.  Please note: We ask at that you not bring children with you during ultrasound (echo/ vascular) testing. Due to room size and safety concerns, children are not allowed in the ultrasound rooms during exams. Our front office staff cannot provide observation of children in our lobby area while testing is being conducted. An adult accompanying a patient to their appointment will only be allowed in the ultrasound room at the discretion of the ultrasound technician under special circumstances. We apologize for any inconvenience.  Follow-Up: Your physician recommends that you schedule a follow-up appointment in: 3 months   Any Other Special Instructions Will Be Listed Below (If Applicable).  If you need a refill on your cardiac medications before your next appointment, please call your pharmacy.

## 2023-11-13 NOTE — Telephone Encounter (Signed)
Verbal order given to pharmacy

## 2023-12-09 ENCOUNTER — Other Ambulatory Visit: Payer: Self-pay | Admitting: Pharmacist Clinician (PhC)/ Clinical Pharmacy Specialist

## 2023-12-09 MED ORDER — REPATHA SURECLICK 140 MG/ML ~~LOC~~ SOAJ: 1.0000 mL | SUBCUTANEOUS | 3 refills | Status: DC

## 2023-12-19 ENCOUNTER — Other Ambulatory Visit: Payer: Medicaid Other

## 2023-12-24 ENCOUNTER — Ambulatory Visit: Payer: Medicaid Other | Attending: Nurse Practitioner

## 2023-12-24 DIAGNOSIS — I255 Ischemic cardiomyopathy: Secondary | ICD-10-CM | POA: Diagnosis not present

## 2023-12-24 LAB — ECHOCARDIOGRAM COMPLETE
AR max vel: 2 cm2
AV Area VTI: 1.83 cm2
AV Area mean vel: 1.84 cm2
AV Mean grad: 7 mm[Hg]
AV Peak grad: 11.3 mm[Hg]
Ao pk vel: 1.68 m/s
Area-P 1/2: 4.44 cm2
Calc EF: 53.3 %
MV VTI: 2.31 cm2
S' Lateral: 3.8 cm
Single Plane A2C EF: 58.3 %
Single Plane A4C EF: 45.4 %

## 2023-12-24 MED ORDER — PERFLUTREN LIPID MICROSPHERE
1.0000 mL | INTRAVENOUS | Status: AC | PRN
Start: 2023-12-24 — End: 2023-12-24
  Administered 2023-12-24: 6.5 mL via INTRAVENOUS

## 2024-01-03 ENCOUNTER — Telehealth: Payer: Self-pay

## 2024-01-03 NOTE — Telephone Encounter (Signed)
Attempted call to [facility] client who's enrollment expired 12/21/23 and who is showing in [facility] and was confirmed in August. She is current with [facility], however has not been seen at [facility] since 01/24/23 and had Stent placed in August 2024 therefore 07/25/23 [facility] appt cancelled due to recovery.  Last A1C 01/24/23 was 7.1 No answer today left message.   Francee Nodal RN [employer]

## 2024-01-10 ENCOUNTER — Telehealth: Payer: Self-pay | Admitting: Cardiology

## 2024-01-10 NOTE — Telephone Encounter (Signed)
Result Note Continues to have mildly reduced pumping function, this has remained the same since last study.  Aortic valve is mildly calcified but functioning appropriately.  I will go over this result with her at next follow-up in further detail. Thanks! Sharlene Dory, AGNP-C

## 2024-01-10 NOTE — Telephone Encounter (Signed)
Patient notified and verbalized understanding. Pt had no questions at this time. ?

## 2024-01-10 NOTE — Telephone Encounter (Signed)
Calling in regards to echo results. Please advise

## 2024-02-13 ENCOUNTER — Encounter: Payer: Self-pay | Admitting: Nurse Practitioner

## 2024-02-13 ENCOUNTER — Ambulatory Visit: Payer: Medicaid Other | Attending: Nurse Practitioner | Admitting: Nurse Practitioner

## 2024-02-13 VITALS — BP 122/70 | HR 84 | Ht 63.0 in | Wt 148.2 lb

## 2024-02-13 DIAGNOSIS — E785 Hyperlipidemia, unspecified: Secondary | ICD-10-CM

## 2024-02-13 DIAGNOSIS — I6523 Occlusion and stenosis of bilateral carotid arteries: Secondary | ICD-10-CM | POA: Diagnosis not present

## 2024-02-13 DIAGNOSIS — I5022 Chronic systolic (congestive) heart failure: Secondary | ICD-10-CM | POA: Diagnosis not present

## 2024-02-13 DIAGNOSIS — I10 Essential (primary) hypertension: Secondary | ICD-10-CM | POA: Diagnosis not present

## 2024-02-13 DIAGNOSIS — E119 Type 2 diabetes mellitus without complications: Secondary | ICD-10-CM

## 2024-02-13 DIAGNOSIS — Z720 Tobacco use: Secondary | ICD-10-CM

## 2024-02-13 DIAGNOSIS — I2510 Atherosclerotic heart disease of native coronary artery without angina pectoris: Secondary | ICD-10-CM | POA: Diagnosis not present

## 2024-02-13 MED ORDER — METFORMIN HCL 500 MG PO TABS: 500.0000 mg | ORAL_TABLET | Freq: Every day | ORAL | 1 refills | Status: AC

## 2024-02-13 MED ORDER — METOPROLOL SUCCINATE ER 25 MG PO TB24
12.5000 mg | ORAL_TABLET | Freq: Two times a day (BID) | ORAL | 1 refills | Status: DC
Start: 2024-02-13 — End: 2024-02-14

## 2024-02-13 NOTE — Progress Notes (Unsigned)
 Cardiology Office Note:  .   Date: 02/13/2024 ID:  Kelsey Padilla, DOB [birth year], MRN [id number] PCP: Health, [facility]  [facility] Providers Cardiologist:  Dina Rich, MD    History of Present Illness: .   Kelsey Padilla is a 58 y.o. female with a PMH of CAD, s/p CABG in 2012 (LIMA-LAD, SVG-OM, SVG-RPDA) and subsequent PCI in 2019 (SVG to OM distal graft), HTN, T2DM, HLD, vitamin B12 deficiency, tobacco abuse, and HFmrEF, who presents today for follow-up.   Last seen by Dr. Dina Rich in 2019. Was lost in follow-up after this d/t insurance issues.  Admission 06/2023 for NSTEMI. EKG negative for acute ST/T wave changes, did show inferior Q waves.  Troponins slightly elevated, flat.  Underwent complex PCI of distal left main into the ostial left circumflex and required multiple wires, cutting balloon angioplasty, shockwave lithotripsy, and drug-eluting stent.  Recommended to continue aspirin and Brilinta for 12 months.  Was referred to the lipid clinic.  Last seen for hospital follow-up on August 02, 2023. Was doing well since hospital d/c. Could not tolerate Jardiance as this caused hives.  11/12/2023 - Today she presents for scheduled follow-up.  She states she is doing well. Denies any acute cardiac complaints or concerns.  Noticed since starting Repatha her blood pressure was elevated, says she is now taking metoprolol succinate 12.5 mg twice daily and home BPs are well-controlled. Denies any chest pain, shortness of breath, palpitations, syncope, presyncope, dizziness, orthopnea, PND, swelling or significant weight changes, acute bleeding, or claudication.  02/13/2024 -presents today for follow-up.  Doing well and denies any acute cardiac complaints or issues. Denies any chest pain, shortness of breath, palpitations, syncope, presyncope, dizziness, orthopnea, PND, swelling or significant weight changes, acute bleeding, or claudication.  Studies Reviewed: Marland Kitchen     EKG: EKG is not ordered today.  Echocardiogram 12/2023: 1. Left ventricular ejection fraction, by estimation, is 40 to 45%. The  left ventricle has mildly decreased function. The left ventricle  demonstrates regional wall motion abnormalities (see scoring  diagram/findings for description). Left ventricular  diastolic parameters are consistent with Grade I diastolic dysfunction  (impaired relaxation).   2. No formed LV mural thrombus noted with Definity contrast.   3. Right ventricular systolic function is normal. The right ventricular  size is normal. Tricuspid regurgitation signal is inadequate for assessing  PA pressure.   4. The mitral valve is degenerative. Trivial mitral valve regurgitation.   5. The aortic valve is tricuspid. There is mild calcification of the  aortic valve. Aortic valve regurgitation is not visualized. Aortic valve  sclerosis/calcification is present, without any evidence of aortic  stenosis. Aortic valve mean gradient  measures 7.0 mmHg.   6. The inferior vena cava is normal in size with greater than 50%  respiratory variability, suggesting right atrial pressure of 3 mmHg.   Comparison(s): Prior images reviewed side by side. LVEF stable in range of  40-45% with wall motion abnormalities consistent with ischemic  cardiomyopathy. RV contraction normal.   Carotid duplex 08/2023: Summary:  Right Carotid: Velocities in the right ICA are consistent with a 40-59% stenosis. Hemodynamically significant plaque >50%  visualized in the CCA. The ECA appears >50% stenosed.   Left Carotid: Velocities in the left ICA are consistent with a 40-59%  stenosis. Non-hemodynamically significant plaque <50% noted in the  CCA. The ECA appears >50% stenosed.   Vertebrals:  Bilateral vertebral arteries demonstrate antegrade flow.  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   *  See table(s) above for measurements and observations.  Suggest follow up study in 12  months.  Echo 07/2023:   1. Left ventricular ejection fraction, by estimation, is 40 to 45%. The  left ventricle has mildly decreased function. The left ventricle  demonstrates regional wall motion abnormalities with akinesis of the basal to mid inferior and the basal inferoseptal wall segments, akinesis of the basal anterolateral wall, severe hypokinesis of the basal to mid inferolateral wall. There is mild concentric left ventricular hypertrophy. Left ventricular diastolic parameters are consistent with Grade I diastolic dysfunction (impaired relaxation).   2. Right ventricular systolic function is mildly reduced. The right ventricular size is normal. Tricuspid regurgitation signal is inadequate for assessing PA pressure.   3. The mitral valve is normal in structure. Trivial mitral valve  regurgitation. No evidence of mitral stenosis.   4. The aortic valve is tricuspid. There is mild calcification of the  aortic valve. Aortic valve regurgitation is not visualized. No aortic  stenosis is present.   5. The inferior vena cava is normal in size with greater than 50%  respiratory variability, suggesting right atrial pressure of 3 mmHg.   LHC 07/2023:    Mid LM to Ost LAD lesion is 95% stenosed.   Prox LAD to Mid LAD lesion is 100% stenosed.   Prox LAD lesion is 50% stenosed.   Prox RCA to Mid RCA lesion is 75% stenosed.   Dist RCA lesion is 100% stenosed.   Origin lesion is 100% stenosed.   Ost 2nd Mrg lesion is 100% stenosed.   Origin lesion is 100% stenosed.   Ost Cx lesion is 75% stenosed.   Non-stenotic Dist Graft to Insertion lesion was previously treated.   A stent was successfully placed.   A stent was successfully placed.   Post intervention, there is a 0% residual stenosis.   Post intervention, there is a 0% residual stenosis.   1.  Patent LIMA to LAD with occluded vein graft to obtuse marginal and occluded vein graft to PDA. 2.  Severe native vessel disease with chronic total  occlusion of right coronary artery and LAD. 3.  Given the occluded vein graft to obtuse marginal, the left circumflex remains unrevascularized and give collaterals to the right coronary artery; for this reason complex PCI of the distal left main into the ostial left circumflex was pursued requiring multiple wires, Cutting Balloon angioplasty, Shockwave lithotripsy, and drug-eluting stent x 1. 4.  LVEDP of 18 mmHg. 5.  Poor left radial pulse necessitating femoral approach. 6.  Successful Perclose management of right femoral arteriotomy.   Recommendation: Dual antiplatelet therapy for at least 1 year and aggressive cardiac risk factor management.   Carotid duplex 09/2019: Summary:  Right Carotid: Velocities in the right ICA are consistent with a 40-59% stenosis. Hemodynamically significant plaque >50% visualized in the CCA. The ECA appears >50% stenosed.   Left Carotid: Velocities in the left ICA are consistent with a 40-59%  stenosis. Hemodynamically significant plaque >50% visualized in the  CCA. The ECA appears >50% stenosed.   Vertebrals:  Bilateral vertebral arteries demonstrate antegrade flow.  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   *See table(s) above for measurements and observations.  Suggest follow up study in 12 months.  Physical Exam:   VS:  BP 122/70   Pulse 84   Ht 5\' 3"  (1.6 m)   Wt 148 lb 3.2 oz (67.2 kg)   SpO2 99%   BMI 26.25 kg/m    Wt Readings  from Last 3 Encounters:  02/13/24 148 lb 3.2 oz (67.2 kg)  11/12/23 143 lb 6.4 oz (65 kg)  08/02/23 145 lb 3.2 oz (65.9 kg)    GEN: Well nourished, well developed in no acute distress NECK: No JVD; No carotid bruits CARDIAC: S1/S2, RRR, no murmurs, rubs, gallops RESPIRATORY:  Clear to auscultation without rales, wheezing or rhonchi  ABDOMEN: Soft, non-tender, non-distended EXTREMITIES:  No edema; No deformity   ASSESSMENT AND PLAN: .    CAD, s/p CABG in 2012, subsequent PCI in 2019 with  complex PCI (2024) In August 2024, she underwent complex PCI of distal left main into the ostial left circumflex and required multiple wires, cutting balloon angioplasty, shockwave lithotripsy, and drug-eluting stent of distal left main into ostial left circumflex. Recommended to continue aspirin and Brilinta for 12 months. Continue Aspirin, Repatha, fenofibrate, simvastatin, brlinita, and NTG PRN.  Increasing metoprolol succinate as mentioned below.  Heart healthy diet and regular cardiovascular exercise encouraged.   2. HFmrEF, ICM Stage C, NYHA class I symptoms. TTE EF 40-45%. Euvolemic and well compensated on exam. Could not tolerate Jardiance, want to avoid SGLT2i.  Will increase metoprolol succinate to 25 mg twice daily.  Continue rest of GDMT.  Low sodium diet, fluid restriction <2L, and daily weights encouraged. Educated to contact our office for weight gain of 2 lbs overnight or 5 lbs in one week.  3. HTN BP stable. SBP goal < 130. Discussed to monitor BP at home at least 2 hours after medications and sitting for 5-10 minutes.  Increasing metoprolol succinate as mentioned above.  Continue rest of medication regimen. Heart healthy diet and regular cardiovascular exercise encouraged.   4. Carotid artery disease, HLD Recent carotid duplex in September 2024 revealed stable 40 to 59% bilateral ICA stenosis, bilateral subclavian artery flow was disturbed.  Asymptomatic at this time.  Continue current medication regimen.  Plan to update this study at next office visit.  5. Tobacco abuse Smoking cessation encouraged and discussed.   6.  Type 2 diabetes Denies any issues.  She is requesting a refill of metformin will provide this for her today.  Continue follow-up with PCP for further management.  Dispo: Will provide refills per her request.  Follow-up with Dr. Dina Rich or APP in 6 months or sooner if anything changes.   Signed, Sharlene Dory, NP

## 2024-02-13 NOTE — Patient Instructions (Addendum)
 Medication Instructions:  Your physician recommends that you continue on your current medications as directed. Please refer to the Current Medication list given to you today.  Labwork: None   Testing/Procedures: None   Follow-Up: Your physician recommends that you schedule a follow-up appointment in: 6 months   Any Other Special Instructions Will Be Listed Below (If Applicable).  If you need a refill on your cardiac medications before your next appointment, please call your pharmacy.

## 2024-02-14 MED ORDER — METOPROLOL SUCCINATE ER 25 MG PO TB24
25.0000 mg | ORAL_TABLET | Freq: Two times a day (BID) | ORAL | 1 refills | Status: DC
Start: 2024-02-14 — End: 2024-08-20

## 2024-02-17 ENCOUNTER — Telehealth: Payer: Self-pay | Admitting: Nurse Practitioner

## 2024-02-17 NOTE — Telephone Encounter (Signed)
-----   Message from Sharlene Dory sent at 02/14/2024 10:48 AM EST ----- I already discussed with patient that was increasing her metoprolol succinate so she is aware about this.  Would recommend to give her a call at your earliest convenience to let her know I sent this to her pharmacy.  Thank so much!  Best, Sharlene Dory, NP

## 2024-02-17 NOTE — Telephone Encounter (Signed)
 Left detailed medication per patient DPR

## 2024-04-28 ENCOUNTER — Other Ambulatory Visit: Payer: Self-pay | Admitting: Nurse Practitioner

## 2024-05-13 ENCOUNTER — Telehealth: Payer: Self-pay | Admitting: Cardiology

## 2024-05-13 NOTE — Telephone Encounter (Signed)
 Pt requesting cb to advise what is safe for her to take for pain otc (pulled muscle) besides Tylenol .

## 2024-05-13 NOTE — Telephone Encounter (Signed)
 Patient informed and verbalized understanding of plan.

## 2024-06-11 ENCOUNTER — Other Ambulatory Visit: Payer: Self-pay | Admitting: Pharmacist Clinician (PhC)/ Clinical Pharmacy Specialist

## 2024-06-11 MED ORDER — REPATHA SURECLICK 140 MG/ML ~~LOC~~ SOAJ: 1.0000 mL | SUBCUTANEOUS | 0 refills | Status: DC

## 2024-07-01 ENCOUNTER — Telehealth: Payer: Self-pay | Admitting: Cardiology

## 2024-07-01 NOTE — Telephone Encounter (Signed)
 06/11/2024 Repatha  rx faxed to [facility] [phone]) Patient informed.

## 2024-07-01 NOTE — Telephone Encounter (Signed)
 Pt c/o medication issue:  1. Name of Medication: Evolocumab  (REPATHA  SURECLICK) 140 MG/ML SOAJ   2. How are you currently taking this medication (dosage and times per day)? As written  3. Are you having a reaction (difficulty breathing--STAT)? no  4. What is your medication issue? Pt contacted [facility]  for a replace. Look like we sent it on 6/26 but they are saying  they don't have it

## 2024-07-07 ENCOUNTER — Other Ambulatory Visit: Payer: Self-pay | Admitting: Cardiology

## 2024-08-06 ENCOUNTER — Other Ambulatory Visit: Payer: Self-pay | Admitting: Nurse Practitioner

## 2024-08-06 DIAGNOSIS — I5022 Chronic systolic (congestive) heart failure: Secondary | ICD-10-CM

## 2024-08-06 DIAGNOSIS — I2510 Atherosclerotic heart disease of native coronary artery without angina pectoris: Secondary | ICD-10-CM

## 2024-08-13 ENCOUNTER — Ambulatory Visit: Payer: Medicaid Other | Admitting: Nurse Practitioner

## 2024-08-20 ENCOUNTER — Telehealth: Payer: Self-pay | Admitting: Cardiology

## 2024-08-20 DIAGNOSIS — I2510 Atherosclerotic heart disease of native coronary artery without angina pectoris: Secondary | ICD-10-CM

## 2024-08-20 DIAGNOSIS — I5022 Chronic systolic (congestive) heart failure: Secondary | ICD-10-CM

## 2024-08-20 MED ORDER — METOPROLOL SUCCINATE ER 25 MG PO TB24
25.0000 mg | ORAL_TABLET | Freq: Two times a day (BID) | ORAL | 1 refills | Status: DC
Start: 2024-08-20 — End: 2024-08-21

## 2024-08-20 NOTE — Telephone Encounter (Signed)
 RX sent in

## 2024-08-20 NOTE — Telephone Encounter (Signed)
*  STAT* If patient is at the pharmacy, call can be transferred to refill team.   1. Which medications need to be refilled? (please list name of each medication and dose if known)   metoprolol  succinate (TOPROL -XL) 25 MG 24 hr tablet   2. Would you like to learn more about the convenience, safety, & potential cost savings by using the [facility]?   3. Are you open to using the [facility] (Type [facility]. ).  4. Which pharmacy/location (including street and city if local pharmacy) is medication to be sent to?  [facility] - [place], [place] - [address]   5. Do they need a 30 day or 90 day supply?     Patient stated she is completely out of this medication.  Patient has appointment scheduled with [name], NP on  10/14.

## 2024-08-21 ENCOUNTER — Telehealth: Payer: Self-pay | Admitting: Cardiology

## 2024-08-21 DIAGNOSIS — I2510 Atherosclerotic heart disease of native coronary artery without angina pectoris: Secondary | ICD-10-CM

## 2024-08-21 DIAGNOSIS — I5022 Chronic systolic (congestive) heart failure: Secondary | ICD-10-CM

## 2024-08-21 MED ORDER — METOPROLOL SUCCINATE ER 25 MG PO TB24
25.0000 mg | ORAL_TABLET | Freq: Two times a day (BID) | ORAL | 1 refills | Status: AC
Start: 2024-08-21 — End: ?

## 2024-08-21 NOTE — Telephone Encounter (Signed)
 Pt's medication was sent to pt's pharmacy as requested. Confirmation received.

## 2024-08-21 NOTE — Telephone Encounter (Signed)
*  STAT* If patient is at the pharmacy, call can be transferred to refill team.   1. Which medications need to be refilled? (please list name of each medication and dose if known)   metoprolol  succinate (TOPROL -XL) 25 MG 24 hr tablet   2. Would you like to learn more about the convenience, safety, & potential cost savings by using the [facility]?   3. Are you open to using the [facility] (Type [facility]. ).  4. Which pharmacy/location (including street and city if local pharmacy) is medication to be sent to?  [facility] - [place], [place] - [address]   5. Do they need a 30 day or 90 day supply?     Patient stated she is completely out of this medication and has appointment scheduled on 10/14 with [name], NP.  Patient stated pharmacy told her they had not received the refill as yet and wants prescription re-sent.

## 2024-08-22 ENCOUNTER — Other Ambulatory Visit: Payer: Self-pay | Admitting: Nurse Practitioner

## 2024-08-22 DIAGNOSIS — E119 Type 2 diabetes mellitus without complications: Secondary | ICD-10-CM

## 2024-09-01 ENCOUNTER — Other Ambulatory Visit: Payer: Self-pay | Admitting: Cardiology

## 2024-09-01 ENCOUNTER — Ambulatory Visit: Admitting: Podiatry

## 2024-09-01 DIAGNOSIS — E119 Type 2 diabetes mellitus without complications: Secondary | ICD-10-CM

## 2024-09-01 NOTE — Telephone Encounter (Signed)
 Pt is requesting a refill on non cardiac medication metformin . Would Dr Alvan like to refill this non cardiac medication? Please address

## 2024-09-01 NOTE — Telephone Encounter (Signed)
*  STAT* If patient is at the pharmacy, call can be transferred to refill team.   1. Which medications need to be refilled? (please list name of each medication and dose if known)   metFORMIN  (GLUCOPHAGE ) 500 MG tablet   2. Would you like to learn more about the convenience, safety, & potential cost savings by using the [facility]?   3. Are you open to using the [facility] (Type [facility]. ).  4. Which pharmacy/location (including street and city if local pharmacy) is medication to be sent to?  [facility] - [place], [place] - [address]   5. Do they need a 30 day or 90 day supply?     Patient stated she will be out of this medication in 4 days.  Patient has appointment scheduled on 10/14 with [name], NP.

## 2024-09-04 ENCOUNTER — Telehealth: Payer: Self-pay | Admitting: Cardiology

## 2024-09-04 NOTE — Telephone Encounter (Signed)
 Called pt per last office note on 02/13/2024 with Almarie Crate, NP for pt to contact PCP to refill non cardiac medication metformin . I advised pt that if she has any other problems, questions or concerns, to give our office a call back. Pt verbalized understanding.

## 2024-09-04 NOTE — Telephone Encounter (Signed)
*  STAT* If patient is at the pharmacy, call can be transferred to refill team.   1. Which medications need to be refilled? (please list name of each medication and dose if known)   metFORMIN  (GLUCOPHAGE ) 500 MG tablet    2. Which pharmacy/location (including street and city if local pharmacy) is medication to be sent to?  [facility] - [place], [place] - [address]      3. Do they need a 30 day or 90 day supply? 90 day    Pt is out of medication

## 2024-09-16 ENCOUNTER — Ambulatory Visit: Admitting: Nurse Practitioner

## 2024-09-29 ENCOUNTER — Ambulatory Visit: Attending: Nurse Practitioner | Admitting: Nurse Practitioner

## 2024-09-29 ENCOUNTER — Encounter: Payer: Self-pay | Admitting: Nurse Practitioner

## 2024-09-29 VITALS — BP 110/76 | HR 67 | Ht 63.0 in | Wt 143.6 lb

## 2024-09-29 DIAGNOSIS — I2510 Atherosclerotic heart disease of native coronary artery without angina pectoris: Secondary | ICD-10-CM | POA: Diagnosis not present

## 2024-09-29 DIAGNOSIS — I6523 Occlusion and stenosis of bilateral carotid arteries: Secondary | ICD-10-CM | POA: Insufficient documentation

## 2024-09-29 DIAGNOSIS — I5020 Unspecified systolic (congestive) heart failure: Secondary | ICD-10-CM | POA: Diagnosis not present

## 2024-09-29 DIAGNOSIS — I10 Essential (primary) hypertension: Secondary | ICD-10-CM | POA: Diagnosis present

## 2024-09-29 DIAGNOSIS — E785 Hyperlipidemia, unspecified: Secondary | ICD-10-CM | POA: Diagnosis present

## 2024-09-29 DIAGNOSIS — Z720 Tobacco use: Secondary | ICD-10-CM | POA: Insufficient documentation

## 2024-09-29 MED ORDER — TICAGRELOR 90 MG PO TABS: 90.0000 mg | ORAL_TABLET | Freq: Two times a day (BID) | ORAL | 10 refills | Status: AC

## 2024-09-29 MED ORDER — NITROGLYCERIN 0.4 MG SL SUBL: 0.4000 mg | SUBLINGUAL_TABLET | SUBLINGUAL | 3 refills | Status: AC | PRN

## 2024-09-29 NOTE — Patient Instructions (Addendum)
 Medication Instructions:  Your physician recommends that you continue on your current medications as directed. Please refer to the Current Medication list given to you today.  Labwork: None   Testing/Procedures: Your physician has requested that you have a carotid duplex. This test is an ultrasound of the carotid arteries in your neck. It looks at blood flow through these arteries that supply the brain with blood. Allow one hour for this exam. There are no restrictions or special instructions.  Follow-Up: Your physician recommends that you schedule a follow-up appointment in: 6 Months with Dr. Alvan   Any Other Special Instructions Will Be Listed Below (If Applicable).  If you need a refill on your cardiac medications before your next appointment, please call your pharmacy.

## 2024-09-29 NOTE — Progress Notes (Signed)
 Cardiology Office Note:  .   Date: 09/29/2024 ID:  Kelsey Padilla, DOB [id number], MRN [id number] PCP: Teresa Jenkins Jansky, FNP  [facility] Providers Cardiologist:  Alvan Carrier, MD    History of Present Illness: .   Kelsey Padilla is a 58 y.o. female with a PMH of CAD, s/p CABG in 2012 (LIMA-LAD, SVG-OM, SVG-RPDA) and subsequent PCI in 2019 (SVG to OM distal graft), HTN, T2DM, HLD, vitamin B12 deficiency, tobacco abuse, and HFmrEF, who presents today for follow-up.   Last seen by Dr. Carrier Alvan in 2019. Was lost in follow-up after this d/t insurance issues.  Admission 06/2023 for NSTEMI. EKG negative for acute ST/T wave changes, did show inferior Q waves.  Troponins slightly elevated, flat.  Underwent complex PCI of distal left main into the ostial left circumflex and required multiple wires, cutting balloon angioplasty, shockwave lithotripsy, and drug-eluting stent.  Recommended to continue aspirin  and Brilinta  for 12 months.  Was referred to the lipid clinic.  Last seen for hospital follow-up on August 02, 2023. Was doing well since hospital d/c. Could not tolerate Jardiance  as this caused hives.  11/12/2023 - Today she presents for scheduled follow-up.  She states she is doing well. Denies any acute cardiac complaints or concerns.  Noticed since starting Repatha  her blood pressure was elevated, says she is now taking metoprolol  succinate 12.5 mg twice daily and home BPs are well-controlled. Denies any chest pain, shortness of breath, palpitations, syncope, presyncope, dizziness, orthopnea, PND, swelling or significant weight changes, acute bleeding, or claudication.  02/13/2024 -presents today for follow-up.  Doing well and denies any acute cardiac complaints or issues. Denies any chest pain, shortness of breath, palpitations, syncope, presyncope, dizziness, orthopnea, PND, swelling or significant weight changes, acute bleeding, or claudication.  09/29/2024 -  Here for  follow-up. Doing well and denies any acute cardiac complaints or issues. States she was on a previous medication prescribed by another provider, had an allergic reaction and believes she now has psoriasis along both feet, says this is looking better and clearing up per her report. PCP is aware per her report. Denies any chest pain, shortness of breath, palpitations, syncope, presyncope, dizziness, orthopnea, PND, swelling or significant weight changes, acute bleeding, or claudication.  Studies Reviewed: Kelsey Padilla    EKG:  EKG Interpretation Date/Time:  Tuesday September 29 2024 09:58:55 EDT Ventricular Rate:  67 PR Interval:  168 QRS Duration:  98 QT Interval:  426 QTC Calculation: 450 R Axis:   2  Text Interpretation: Normal sinus rhythm Possible Lateral infarct , age undetermined Inferior infarct (cited on or before 18-Jul-2023) When compared with ECG of 19-Jul-2023 03:40, Borderline criteria for Lateral infarct are now Present Nonspecific T wave abnormality now evident in Lateral leads Confirmed by Miriam Norris [phone]) on 09/29/2024 10:04:24 AM    Echocardiogram 12/2023: 1. Left ventricular ejection fraction, by estimation, is 40 to 45%. The  left ventricle has mildly decreased function. The left ventricle  demonstrates regional wall motion abnormalities (see scoring  diagram/findings for description). Left ventricular  diastolic parameters are consistent with Grade I diastolic dysfunction  (impaired relaxation).   2. No formed LV mural thrombus noted with Definity  contrast.   3. Right ventricular systolic function is normal. The right ventricular  size is normal. Tricuspid regurgitation signal is inadequate for assessing  PA pressure.   4. The mitral valve is degenerative. Trivial mitral valve regurgitation.   5. The aortic valve is tricuspid. There is mild calcification of the  aortic valve. Aortic valve regurgitation is not visualized. Aortic valve  sclerosis/calcification is present,  without any evidence of aortic  stenosis. Aortic valve mean gradient  measures 7.0 mmHg.   6. The inferior vena cava is normal in size with greater than 50%  respiratory variability, suggesting right atrial pressure of 3 mmHg.   Comparison(s): Prior images reviewed side by side. LVEF stable in range of  40-45% with wall motion abnormalities consistent with ischemic  cardiomyopathy. RV contraction normal.   Carotid duplex 08/2023: Summary:  Right Carotid: Velocities in the right ICA are consistent with a 40-59% stenosis. Hemodynamically significant plaque >50%  visualized in the CCA. The ECA appears >50% stenosed.   Left Carotid: Velocities in the left ICA are consistent with a 40-59%  stenosis. Non-hemodynamically significant plaque <50% noted in the  CCA. The ECA appears >50% stenosed.   Vertebrals:  Bilateral vertebral arteries demonstrate antegrade flow.  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   *See table(s) above for measurements and observations.  Suggest follow up study in 12 months.  Echo 07/2023:   1. Left ventricular ejection fraction, by estimation, is 40 to 45%. The  left ventricle has mildly decreased function. The left ventricle  demonstrates regional wall motion abnormalities with akinesis of the basal to mid inferior and the basal inferoseptal wall segments, akinesis of the basal anterolateral wall, severe hypokinesis of the basal to mid inferolateral wall. There is mild concentric left ventricular hypertrophy. Left ventricular diastolic parameters are consistent with Grade I diastolic dysfunction (impaired relaxation).   2. Right ventricular systolic function is mildly reduced. The right ventricular size is normal. Tricuspid regurgitation signal is inadequate for assessing PA pressure.   3. The mitral valve is normal in structure. Trivial mitral valve  regurgitation. No evidence of mitral stenosis.   4. The aortic valve is tricuspid. There is mild  calcification of the  aortic valve. Aortic valve regurgitation is not visualized. No aortic  stenosis is present.   5. The inferior vena cava is normal in size with greater than 50%  respiratory variability, suggesting right atrial pressure of 3 mmHg.   LHC 07/2023:    Mid LM to Ost LAD lesion is 95% stenosed.   Prox LAD to Mid LAD lesion is 100% stenosed.   Prox LAD lesion is 50% stenosed.   Prox RCA to Mid RCA lesion is 75% stenosed.   Dist RCA lesion is 100% stenosed.   Origin lesion is 100% stenosed.   Ost 2nd Mrg lesion is 100% stenosed.   Origin lesion is 100% stenosed.   Ost Cx lesion is 75% stenosed.   Non-stenotic Dist Graft to Insertion lesion was previously treated.   A stent was successfully placed.   A stent was successfully placed.   Post intervention, there is a 0% residual stenosis.   Post intervention, there is a 0% residual stenosis.   1.  Patent LIMA to LAD with occluded vein graft to obtuse marginal and occluded vein graft to PDA. 2.  Severe native vessel disease with chronic total occlusion of right coronary artery and LAD. 3.  Given the occluded vein graft to obtuse marginal, the left circumflex remains unrevascularized and give collaterals to the right coronary artery; for this reason complex PCI of the distal left main into the ostial left circumflex was pursued requiring multiple wires, Cutting Balloon angioplasty, Shockwave lithotripsy, and drug-eluting stent x 1. 4.  LVEDP of 18 mmHg. 5.  Poor left radial pulse necessitating femoral approach. 6.  Successful Perclose management of right femoral arteriotomy.   Recommendation: Dual antiplatelet therapy for at least 1 year and aggressive cardiac risk factor management.   Carotid duplex 09/2019: Summary:  Right Carotid: Velocities in the right ICA are consistent with a 40-59% stenosis. Hemodynamically significant plaque >50% visualized in the CCA. The ECA appears >50% stenosed.   Left Carotid: Velocities in the  left ICA are consistent with a 40-59%  stenosis. Hemodynamically significant plaque >50% visualized in the  CCA. The ECA appears >50% stenosed.   Vertebrals:  Bilateral vertebral arteries demonstrate antegrade flow.  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   *See table(s) above for measurements and observations.  Suggest follow up study in 12 months.  Physical Exam:   VS:  BP 110/76 (BP Location: Left Arm)   Pulse 67   Ht 5' 3 (1.6 m)   Wt 143 lb 9.6 oz (65.1 kg)   SpO2 99%   BMI 25.44 kg/m    Wt Readings from Last 3 Encounters:  09/29/24 143 lb 9.6 oz (65.1 kg)  02/13/24 148 lb 3.2 oz (67.2 kg)  11/12/23 143 lb 6.4 oz (65 kg)    GEN: Well nourished, well developed in no acute distress NECK: No JVD; No carotid bruits CARDIAC: S1/S2, RRR, no murmurs, rubs, gallops RESPIRATORY:  Clear to auscultation without rales, wheezing or rhonchi  ABDOMEN: Soft, non-tender, non-distended EXTREMITIES:  No edema; No deformity   ASSESSMENT AND PLAN: .    CAD, s/p CABG in 2012, subsequent PCI in 2019 with complex PCI (2024) In August 2024, she underwent complex PCI of distal left main into the ostial left circumflex and required multiple wires, cutting balloon angioplasty, shockwave lithotripsy, and drug-eluting stent of distal left main into ostial left circumflex. Continue current medication regimen. Will provide refills per her request. Heart healthy diet and regular cardiovascular exercise encouraged.   2. HFmrEF, ICM Stage C, NYHA class I symptoms. TTE EF 40-45%. Euvolemic and well compensated on exam. Could not tolerate Jardiance , want to avoid SGLT2i.  Continue current GDMT.  Low sodium diet, fluid restriction <2L, and daily weights encouraged. Educated to contact our office for weight gain of 2 lbs overnight or 5 lbs in one week.  3. HTN BP stable. SBP goal < 130. Discussed to monitor BP at home at least 2 hours after medications and sitting for 5-10 minutes.  Continue current medication regimen. Heart healthy diet and regular cardiovascular exercise encouraged.   4. Carotid artery disease, HLD Most recent carotid duplex in September 2024 revealed stable 40 to 59% bilateral ICA stenosis, bilateral subclavian artery flow was disturbed.  Asymptomatic at this time.  Continue current medication regimen.  Will update study at this time.   5. Tobacco abuse Smoking cessation encouraged and discussed.   Dispo: Will provide refills per her request.  Follow-up with Dr. Dorn Ross or APP in 6 months or sooner if anything changes.   Signed, Almarie Crate, NP

## 2024-10-08 ENCOUNTER — Telehealth: Payer: Self-pay | Admitting: Cardiology

## 2024-10-08 DIAGNOSIS — I2510 Atherosclerotic heart disease of native coronary artery without angina pectoris: Secondary | ICD-10-CM

## 2024-10-08 NOTE — Telephone Encounter (Signed)
 Pt c/o medication issue:  1. Name of Medication:   Evolocumab  (REPATHA  SURECLICK) 140 MG/ML SOAJ   2. How are you currently taking this medication (dosage and times per day)?   As prescribed  3. Are you having a reaction (difficulty breathing--STAT)?   4. What is your medication issue?   Patient stated she was supposed to get a replacement for this medication and wants a call back to [facility] - [place], [place] - [address], Claim# [phone] to approve the replacement.

## 2024-10-12 MED ORDER — REPATHA SURECLICK 140 MG/ML ~~LOC~~ SOAJ: 1.0000 mL | SUBCUTANEOUS | 0 refills | Status: DC

## 2024-10-12 NOTE — Telephone Encounter (Signed)
 Rx sent to [facility]

## 2024-10-13 ENCOUNTER — Ambulatory Visit: Attending: Nurse Practitioner

## 2024-10-13 DIAGNOSIS — I6523 Occlusion and stenosis of bilateral carotid arteries: Secondary | ICD-10-CM | POA: Insufficient documentation

## 2024-10-21 ENCOUNTER — Telehealth: Payer: Self-pay | Admitting: Cardiology

## 2024-10-21 NOTE — Telephone Encounter (Signed)
 Pt calling to f/u on results from her carotid imaging performed 10/28. Please advise.

## 2024-10-22 ENCOUNTER — Ambulatory Visit: Payer: Self-pay | Admitting: Nurse Practitioner

## 2024-10-22 NOTE — Telephone Encounter (Signed)
 The patient has been notified of the result and verbalized understanding.  All questions (if any) were answered. Littie CHRISTELLA Croak, CMA 10/22/2024 1:36 PM

## 2024-10-22 NOTE — Telephone Encounter (Signed)
 Patient notified of result.  Please refer to phone note from today for complete details.   Littie CHRISTELLA Croak, CMA 10/22/2024 1:37 PM

## 2024-10-26 ENCOUNTER — Other Ambulatory Visit: Payer: Self-pay | Admitting: Cardiology

## 2024-10-28 MED ORDER — SIMVASTATIN 40 MG PO TABS: 40.0000 mg | ORAL_TABLET | Freq: Every day | ORAL | 3 refills | Status: AC

## 2024-11-24 ENCOUNTER — Telehealth: Payer: Self-pay | Admitting: Nurse Practitioner

## 2024-11-24 DIAGNOSIS — I2510 Atherosclerotic heart disease of native coronary artery without angina pectoris: Secondary | ICD-10-CM

## 2024-11-24 NOTE — Telephone Encounter (Signed)
*  STAT* If patient is at the pharmacy, call can be transferred to refill team.   1. Which medications need to be refilled? (please list name of each medication and dose if known)   Evolocumab  (REPATHA  SURECLICK) 140 MG/ML SOAJ   2. Would you like to learn more about the convenience, safety, & potential cost savings by using the [facility]?   3. Are you open to using the [facility] (Type [facility]. ).  4. Which pharmacy/location (including street and city if local pharmacy) is medication to be sent to?  [facility] - [place], [place] - [address]  5. Do they need a 30 day or 90 day supply?   30 day  Patient stated she is completely out of this medication.

## 2024-12-02 ENCOUNTER — Telehealth: Payer: Self-pay | Admitting: Cardiology

## 2024-12-02 ENCOUNTER — Other Ambulatory Visit: Payer: Self-pay | Admitting: Nurse Practitioner

## 2024-12-02 NOTE — Telephone Encounter (Signed)
 Pt c/o medication issue:  1. Name of Medication:   REPATHA  SURECLICK 140 MG/ML SOAJ    2. How are you currently taking this medication (dosage and times per day)? As prescribed   3. Are you having a reaction (difficulty breathing--STAT)? No   4. What is your medication issue? Pt calling to state [facility] - [place], [place] - [address]  advised her to contact the office in regard to her Repatha  replacement. Please advise.

## 2024-12-02 NOTE — Telephone Encounter (Signed)
 Spoke with patient regarding her Repatha . Advised her that she has refills. She stated that she has been waiting on a replacement. [facility] is going to fax us  the form to have a replacement sent out to patient. Advised her so far haven't received yet. Will check back once we receive will need provider to sign and fax back

## 2024-12-03 NOTE — Telephone Encounter (Signed)
 Received replacement fax from [facility] signed by provider

## 2024-12-28 ENCOUNTER — Other Ambulatory Visit: Payer: Self-pay | Admitting: Nurse Practitioner

## 2025-04-13 ENCOUNTER — Ambulatory Visit: Admitting: Cardiology
